# Patient Record
Sex: Female | Born: 1945 | Race: White | Hispanic: No | Marital: Single | Smoking: Former smoker
Health system: Southern US, Community
[De-identification: ages and names within clinical notes are randomized; demographics above are authoritative.]

## PROBLEM LIST (undated history)

## (undated) DIAGNOSIS — K5732 Diverticulitis of large intestine without perforation or abscess without bleeding: Secondary | ICD-10-CM

## (undated) DIAGNOSIS — I059 Rheumatic mitral valve disease, unspecified: Secondary | ICD-10-CM

## (undated) DIAGNOSIS — N189 Chronic kidney disease, unspecified: Secondary | ICD-10-CM

## (undated) DIAGNOSIS — M8580 Other specified disorders of bone density and structure, unspecified site: Secondary | ICD-10-CM

## (undated) DIAGNOSIS — K635 Polyp of colon: Secondary | ICD-10-CM

## (undated) DIAGNOSIS — Q613 Polycystic kidney, unspecified: Secondary | ICD-10-CM

## (undated) DIAGNOSIS — I10 Essential (primary) hypertension: Secondary | ICD-10-CM

## (undated) DIAGNOSIS — C801 Malignant (primary) neoplasm, unspecified: Secondary | ICD-10-CM

## (undated) DIAGNOSIS — Z5189 Encounter for other specified aftercare: Secondary | ICD-10-CM

## (undated) DIAGNOSIS — E785 Hyperlipidemia, unspecified: Secondary | ICD-10-CM

## (undated) HISTORY — DX: Hyperlipidemia, unspecified: E78.5

## (undated) HISTORY — DX: Diverticulitis of large intestine without perforation or abscess without bleeding: K57.32

## (undated) HISTORY — DX: Polyp of colon: K63.5

## (undated) HISTORY — DX: Malignant (primary) neoplasm, unspecified: C80.1

## (undated) HISTORY — DX: Essential (primary) hypertension: I10

## (undated) HISTORY — DX: Rheumatic mitral valve disease, unspecified: I05.9

## (undated) HISTORY — PX: TONSILLECTOMY: SUR1361

## (undated) HISTORY — PX: ESOPHAGOGASTRODUODENOSCOPY: SHX1529

## (undated) HISTORY — DX: Chronic kidney disease, unspecified: N18.9

## (undated) HISTORY — DX: Polycystic kidney, unspecified: Q61.3

## (undated) HISTORY — DX: Encounter for other specified aftercare: Z51.89

## (undated) HISTORY — DX: Other specified disorders of bone density and structure, unspecified site: M85.80

---

## 1985-02-25 HISTORY — PX: ABDOMINAL HYSTERECTOMY: SHX81

## 1989-02-25 DIAGNOSIS — K5732 Diverticulitis of large intestine without perforation or abscess without bleeding: Secondary | ICD-10-CM

## 1989-02-25 HISTORY — DX: Diverticulitis of large intestine without perforation or abscess without bleeding: K57.32

## 1997-02-25 HISTORY — PX: OTHER SURGICAL HISTORY: SHX169

## 1997-05-30 ENCOUNTER — Other Ambulatory Visit: Admission: RE | Admit: 1997-05-30 | Discharge: 1997-05-30 | Payer: Self-pay | Admitting: Obstetrics and Gynecology

## 1998-10-06 ENCOUNTER — Other Ambulatory Visit: Admission: RE | Admit: 1998-10-06 | Discharge: 1998-10-06 | Payer: Self-pay | Admitting: Obstetrics and Gynecology

## 1999-11-14 ENCOUNTER — Ambulatory Visit (HOSPITAL_COMMUNITY): Admission: RE | Admit: 1999-11-14 | Discharge: 1999-11-14 | Payer: Self-pay | Admitting: Nephrology

## 1999-11-14 ENCOUNTER — Encounter: Payer: Self-pay | Admitting: Nephrology

## 1999-11-15 ENCOUNTER — Other Ambulatory Visit: Admission: RE | Admit: 1999-11-15 | Discharge: 1999-11-15 | Payer: Self-pay | Admitting: Obstetrics and Gynecology

## 2000-11-17 ENCOUNTER — Other Ambulatory Visit: Admission: RE | Admit: 2000-11-17 | Discharge: 2000-11-17 | Payer: Self-pay | Admitting: Obstetrics and Gynecology

## 2001-02-06 ENCOUNTER — Encounter: Payer: Self-pay | Admitting: Gastroenterology

## 2001-07-10 ENCOUNTER — Encounter: Payer: Self-pay | Admitting: Family Medicine

## 2001-07-10 ENCOUNTER — Ambulatory Visit (HOSPITAL_COMMUNITY): Admission: RE | Admit: 2001-07-10 | Discharge: 2001-07-10 | Payer: Self-pay | Admitting: Family Medicine

## 2003-12-27 ENCOUNTER — Ambulatory Visit: Payer: Self-pay | Admitting: Internal Medicine

## 2004-01-18 ENCOUNTER — Ambulatory Visit: Payer: Self-pay | Admitting: Internal Medicine

## 2005-01-03 ENCOUNTER — Ambulatory Visit: Payer: Self-pay | Admitting: Internal Medicine

## 2005-04-24 ENCOUNTER — Ambulatory Visit: Payer: Self-pay | Admitting: Internal Medicine

## 2006-01-02 ENCOUNTER — Ambulatory Visit: Payer: Self-pay | Admitting: Internal Medicine

## 2006-05-14 ENCOUNTER — Ambulatory Visit: Payer: Self-pay | Admitting: Internal Medicine

## 2006-05-14 LAB — CONVERTED CEMR LAB
ALT: 18 units/L (ref 0–40)
AST: 26 units/L (ref 0–37)
Albumin: 3.9 g/dL (ref 3.5–5.2)
Alkaline Phosphatase: 40 units/L (ref 39–117)
BUN: 29 mg/dL — ABNORMAL HIGH (ref 6–23)
Basophils Absolute: 0.2 10*3/uL — ABNORMAL HIGH (ref 0.0–0.1)
Basophils Relative: 2.6 % — ABNORMAL HIGH (ref 0.0–1.0)
Bilirubin, Direct: 0.1 mg/dL (ref 0.0–0.3)
CO2: 30 meq/L (ref 19–32)
Calcium: 9.8 mg/dL (ref 8.4–10.5)
Chloride: 101 meq/L (ref 96–112)
Cholesterol: 267 mg/dL (ref 0–200)
Creatinine, Ser: 1.6 mg/dL — ABNORMAL HIGH (ref 0.4–1.2)
Direct LDL: 134.5 mg/dL
Eosinophils Absolute: 0.1 10*3/uL (ref 0.0–0.6)
Eosinophils Relative: 1.3 % (ref 0.0–5.0)
Free T4: 0.8 ng/dL (ref 0.6–1.6)
GFR calc Af Amer: 43 mL/min
GFR calc non Af Amer: 35 mL/min
Glucose, Bld: 93 mg/dL (ref 70–99)
HCT: 38 % (ref 36.0–46.0)
HDL: 89 mg/dL (ref >39.0)
Hemoglobin: 13.5 g/dL (ref 12.0–15.0)
Lymphocytes Relative: 22.9 % (ref 12.0–46.0)
MCHC: 35.5 g/dL (ref 30.0–36.0)
MCV: 91.2 fL (ref 78.0–100.0)
Monocytes Absolute: 0.7 10*3/uL (ref 0.2–0.7)
Monocytes Relative: 10.2 % (ref 3.0–11.0)
Neutro Abs: 4.1 10*3/uL (ref 1.4–7.7)
Neutrophils Relative %: 63 % (ref 43.0–77.0)
Platelets: 293 10*3/uL (ref 150–400)
Potassium: 3.8 meq/L (ref 3.5–5.1)
RBC: 4.16 M/uL (ref 3.87–5.11)
RDW: 11.1 % — ABNORMAL LOW (ref 11.5–14.6)
Sodium: 140 meq/L (ref 135–145)
T3, Free: 3.4 pg/mL (ref 2.3–4.2)
TSH: 1.67 microintl units/mL (ref 0.35–5.50)
Total Bilirubin: 0.8 mg/dL (ref 0.3–1.2)
Total CHOL/HDL Ratio: 3
Total Protein: 7.3 g/dL (ref 6.0–8.3)
Triglycerides: 233 mg/dL (ref 0–149)
VLDL: 47 mg/dL — ABNORMAL HIGH (ref 0–40)
WBC: 6.6 10*3/uL (ref 4.5–10.5)

## 2006-05-26 ENCOUNTER — Ambulatory Visit: Payer: Self-pay | Admitting: Gastroenterology

## 2006-06-04 ENCOUNTER — Ambulatory Visit: Payer: Self-pay | Admitting: Internal Medicine

## 2006-11-10 ENCOUNTER — Encounter: Payer: Self-pay | Admitting: Internal Medicine

## 2006-11-11 ENCOUNTER — Ambulatory Visit: Payer: Self-pay | Admitting: Internal Medicine

## 2006-11-11 DIAGNOSIS — M545 Low back pain, unspecified: Secondary | ICD-10-CM | POA: Insufficient documentation

## 2006-11-12 ENCOUNTER — Ambulatory Visit: Payer: Self-pay | Admitting: Internal Medicine

## 2006-11-14 ENCOUNTER — Encounter (INDEPENDENT_AMBULATORY_CARE_PROVIDER_SITE_OTHER): Payer: Self-pay | Admitting: *Deleted

## 2006-11-14 LAB — CONVERTED CEMR LAB
ALT: 19 units/L (ref 0–35)
AST: 22 units/L (ref 0–37)
Albumin: 3.7 g/dL (ref 3.5–5.2)
Alkaline Phosphatase: 42 units/L (ref 39–117)
BUN: 31 mg/dL — ABNORMAL HIGH (ref 6–23)
Bilirubin, Direct: 0.1 mg/dL (ref 0.0–0.3)
CO2: 28 meq/L (ref 19–32)
Calcium: 9.6 mg/dL (ref 8.4–10.5)
Chloride: 104 meq/L (ref 96–112)
Cholesterol: 242 mg/dL (ref 0–200)
Creatinine, Ser: 1.7 mg/dL — ABNORMAL HIGH (ref 0.4–1.2)
Direct LDL: 111.7 mg/dL
GFR calc Af Amer: 40 mL/min
GFR calc non Af Amer: 33 mL/min
Glucose, Bld: 108 mg/dL — ABNORMAL HIGH (ref 70–99)
HDL: 94.1 mg/dL (ref >39.0)
Potassium: 4.1 meq/L (ref 3.5–5.1)
Sodium: 142 meq/L (ref 135–145)
Total Bilirubin: 0.8 mg/dL (ref 0.3–1.2)
Total CHOL/HDL Ratio: 2.6
Total Protein: 7.1 g/dL (ref 6.0–8.3)
Triglycerides: 157 mg/dL — ABNORMAL HIGH (ref 0–149)
VLDL: 31 mg/dL (ref 0–40)

## 2006-11-17 ENCOUNTER — Encounter: Payer: Self-pay | Admitting: Internal Medicine

## 2006-11-17 ENCOUNTER — Ambulatory Visit: Payer: Self-pay

## 2006-11-26 ENCOUNTER — Telehealth: Payer: Self-pay | Admitting: Internal Medicine

## 2006-12-15 ENCOUNTER — Encounter: Payer: Self-pay | Admitting: Internal Medicine

## 2007-02-03 ENCOUNTER — Ambulatory Visit: Payer: Self-pay | Admitting: Internal Medicine

## 2007-02-18 ENCOUNTER — Encounter: Payer: Self-pay | Admitting: Internal Medicine

## 2007-04-08 ENCOUNTER — Encounter: Payer: Self-pay | Admitting: Internal Medicine

## 2007-07-22 ENCOUNTER — Ambulatory Visit: Payer: Self-pay | Admitting: Internal Medicine

## 2007-07-22 DIAGNOSIS — Z8719 Personal history of other diseases of the digestive system: Secondary | ICD-10-CM | POA: Insufficient documentation

## 2007-07-22 DIAGNOSIS — Q613 Polycystic kidney, unspecified: Secondary | ICD-10-CM | POA: Insufficient documentation

## 2007-07-22 DIAGNOSIS — K5730 Diverticulosis of large intestine without perforation or abscess without bleeding: Secondary | ICD-10-CM

## 2007-07-26 LAB — CONVERTED CEMR LAB
BUN: 34 mg/dL — ABNORMAL HIGH (ref 6–23)
Chloride: 97 meq/L (ref 96–112)
Eosinophils Relative: 5.2 % — ABNORMAL HIGH (ref 0.0–5.0)
GFR calc Af Amer: 37 mL/min
Glucose, Bld: 74 mg/dL (ref 70–99)
HCT: 36.7 % (ref 36.0–46.0)
Monocytes Absolute: 0.3 10*3/uL (ref 0.1–1.0)
Monocytes Relative: 4.3 % (ref 3.0–12.0)
Platelets: 265 10*3/uL (ref 150–400)
Potassium: 5 meq/L (ref 3.5–5.1)
RBC: 3.97 M/uL (ref 3.87–5.11)
WBC: 7.6 10*3/uL (ref 4.5–10.5)

## 2007-07-27 ENCOUNTER — Encounter (INDEPENDENT_AMBULATORY_CARE_PROVIDER_SITE_OTHER): Payer: Self-pay | Admitting: *Deleted

## 2007-08-31 ENCOUNTER — Encounter: Payer: Self-pay | Admitting: Internal Medicine

## 2007-11-18 ENCOUNTER — Ambulatory Visit: Payer: Self-pay | Admitting: Internal Medicine

## 2007-11-18 ENCOUNTER — Telehealth (INDEPENDENT_AMBULATORY_CARE_PROVIDER_SITE_OTHER): Payer: Self-pay | Admitting: *Deleted

## 2008-03-03 ENCOUNTER — Encounter: Payer: Self-pay | Admitting: Internal Medicine

## 2008-04-11 ENCOUNTER — Encounter: Payer: Self-pay | Admitting: Internal Medicine

## 2008-07-04 ENCOUNTER — Ambulatory Visit: Payer: Self-pay | Admitting: Internal Medicine

## 2008-07-04 DIAGNOSIS — Z860100 Personal history of colon polyps, unspecified: Secondary | ICD-10-CM | POA: Insufficient documentation

## 2008-07-04 DIAGNOSIS — I10 Essential (primary) hypertension: Secondary | ICD-10-CM | POA: Insufficient documentation

## 2008-07-04 DIAGNOSIS — E785 Hyperlipidemia, unspecified: Secondary | ICD-10-CM | POA: Insufficient documentation

## 2008-07-10 LAB — CONVERTED CEMR LAB
Alkaline Phosphatase: 39 units/L (ref 39–117)
Bilirubin, Direct: 0 mg/dL (ref 0.0–0.3)
Calcium: 9.5 mg/dL (ref 8.4–10.5)
GFR calc non Af Amer: 24.11 mL/min (ref >60)
HDL: 89.3 mg/dL (ref >39.00)
Sodium: 141 meq/L (ref 135–145)
Total Bilirubin: 0.8 mg/dL (ref 0.3–1.2)
Total CHOL/HDL Ratio: 3
Triglycerides: 183 mg/dL — ABNORMAL HIGH (ref 0.0–149.0)

## 2008-07-11 ENCOUNTER — Encounter (INDEPENDENT_AMBULATORY_CARE_PROVIDER_SITE_OTHER): Payer: Self-pay | Admitting: *Deleted

## 2008-07-11 ENCOUNTER — Telehealth (INDEPENDENT_AMBULATORY_CARE_PROVIDER_SITE_OTHER): Payer: Self-pay | Admitting: *Deleted

## 2008-07-19 ENCOUNTER — Ambulatory Visit: Payer: Self-pay | Admitting: Internal Medicine

## 2008-07-19 ENCOUNTER — Encounter (INDEPENDENT_AMBULATORY_CARE_PROVIDER_SITE_OTHER): Payer: Self-pay | Admitting: *Deleted

## 2008-07-19 LAB — CONVERTED CEMR LAB
OCCULT 1: NEGATIVE
OCCULT 2: NEGATIVE

## 2008-09-27 ENCOUNTER — Encounter: Payer: Self-pay | Admitting: Internal Medicine

## 2008-09-28 ENCOUNTER — Encounter: Payer: Self-pay | Admitting: Internal Medicine

## 2008-12-16 ENCOUNTER — Encounter: Payer: Self-pay | Admitting: Internal Medicine

## 2008-12-22 ENCOUNTER — Encounter (INDEPENDENT_AMBULATORY_CARE_PROVIDER_SITE_OTHER): Payer: Self-pay | Admitting: *Deleted

## 2009-02-21 ENCOUNTER — Telehealth: Payer: Self-pay | Admitting: Internal Medicine

## 2009-02-25 HISTORY — PX: COLONOSCOPY: SHX174

## 2009-03-21 ENCOUNTER — Encounter: Payer: Self-pay | Admitting: Internal Medicine

## 2009-04-06 ENCOUNTER — Encounter: Payer: Self-pay | Admitting: Internal Medicine

## 2009-04-12 ENCOUNTER — Encounter: Payer: Self-pay | Admitting: Internal Medicine

## 2009-07-25 ENCOUNTER — Ambulatory Visit: Payer: Self-pay | Admitting: Internal Medicine

## 2009-07-25 ENCOUNTER — Telehealth (INDEPENDENT_AMBULATORY_CARE_PROVIDER_SITE_OTHER): Payer: Self-pay | Admitting: *Deleted

## 2009-08-10 ENCOUNTER — Ambulatory Visit: Payer: Self-pay | Admitting: Internal Medicine

## 2009-08-10 LAB — CONVERTED CEMR LAB
Bilirubin Urine: NEGATIVE
Glucose, Urine, Semiquant: NEGATIVE
Specific Gravity, Urine: <1.005
Urobilinogen, UA: 0.2

## 2009-08-17 ENCOUNTER — Ambulatory Visit: Payer: Self-pay | Admitting: Internal Medicine

## 2009-08-17 DIAGNOSIS — N189 Chronic kidney disease, unspecified: Secondary | ICD-10-CM | POA: Insufficient documentation

## 2009-08-17 LAB — CONVERTED CEMR LAB
ALT: 13 units/L (ref 0–35)
AST: 21 units/L (ref 0–37)
Basophils Absolute: 0 10*3/uL (ref 0.0–0.1)
CO2: 26 meq/L (ref 19–32)
Calcium: 8.9 mg/dL (ref 8.4–10.5)
Cholesterol, target level: 200 mg/dL
Cholesterol: 235 mg/dL — ABNORMAL HIGH (ref 0–200)
Direct LDL: 109.8 mg/dL
Eosinophils Absolute: 0.2 10*3/uL (ref 0.0–0.7)
Eosinophils Relative: 2.5 % (ref 0.0–5.0)
GFR calc non Af Amer: 18.65 mL/min (ref >60)
LDL Goal: 160 mg/dL
MCV: 91 fL (ref 78.0–100.0)
Monocytes Absolute: 0.7 10*3/uL (ref 0.1–1.0)
Neutrophils Relative %: 65.6 % (ref 43.0–77.0)
Platelets: 245 10*3/uL (ref 150.0–400.0)
RDW: 12.3 % (ref 11.5–14.6)
Sodium: 140 meq/L (ref 135–145)
TSH: 1.27 microintl units/mL (ref 0.35–5.50)
Total Bilirubin: 0.3 mg/dL (ref 0.3–1.2)
Triglycerides: 231 mg/dL — ABNORMAL HIGH (ref 0.0–149.0)
WBC: 6.3 10*3/uL (ref 4.5–10.5)

## 2009-08-18 ENCOUNTER — Ambulatory Visit: Payer: Self-pay | Admitting: Internal Medicine

## 2009-09-21 ENCOUNTER — Encounter (INDEPENDENT_AMBULATORY_CARE_PROVIDER_SITE_OTHER): Payer: Self-pay | Admitting: *Deleted

## 2009-09-21 ENCOUNTER — Ambulatory Visit: Payer: Self-pay | Admitting: Gastroenterology

## 2009-09-25 ENCOUNTER — Encounter: Payer: Self-pay | Admitting: Internal Medicine

## 2009-10-03 ENCOUNTER — Ambulatory Visit: Payer: Self-pay | Admitting: Gastroenterology

## 2009-12-07 ENCOUNTER — Encounter: Payer: Self-pay | Admitting: Internal Medicine

## 2009-12-29 ENCOUNTER — Encounter: Admission: RE | Admit: 2009-12-29 | Discharge: 2009-12-29 | Payer: Self-pay | Admitting: Nephrology

## 2010-02-05 ENCOUNTER — Encounter: Payer: Self-pay | Admitting: Internal Medicine

## 2010-02-25 HISTORY — PX: BILATERAL SALPINGOOPHORECTOMY: SHX1223

## 2010-03-09 ENCOUNTER — Ambulatory Visit
Admission: RE | Admit: 2010-03-09 | Discharge: 2010-03-09 | Payer: Self-pay | Source: Home / Self Care | Attending: Internal Medicine | Admitting: Internal Medicine

## 2010-03-27 NOTE — Procedures (Signed)
Summary: Colonoscopy  Patient: [name] Note: All result statuses are Final unless otherwise noted.  Tests: (1) Colonoscopy (COL)   COL Colonoscopy           DONE     [facility]     [address]     [place], [place]  [address]           COLONOSCOPY PROCEDURE REPORT           PATIENT:  Pamela Mcknight, Pamela Mcknight  MR#:  [id number]     BIRTHDATE:  11-20-1945, 64 yrs. old  GENDER:  female     ENDOSCOPIST:  Vania Rea. Jarold Motto, MD, [facility]     REF. BY:     PROCEDURE DATE:  10/03/2009     PROCEDURE:  Average-risk screening colonoscopy     G0121     ASA CLASS:  Class III     INDICATIONS:  Routine Risk Screening POLYCYSTIC KIDNEY DISEASE.HX.     OF PRIOR DIVERTICULITIS.     MEDICATIONS:   Fentanyl 50 mcg IV, Versed 4 mg IV           DESCRIPTION OF PROCEDURE:   After the risks benefits and     alternatives of the procedure were thoroughly explained, informed     consent was obtained.  Digital rectal exam was performed and     revealed no abnormalities.   The LB [id number] endoscope     was introduced through the anus and advanced to the cecum, which     was identified by both the appendix and ileocecal valve, without     limitations.  The quality of the prep was excellent, using     MoviPrep.  The instrument was then slowly withdrawn as the colon     was fully examined.     <<PROCEDUREIMAGES>>           FINDINGS:  Severe diverticulosis was found in the sigmoid to     descending colon segments. THICKENED HAUSTRAL FOLDS NOTED.  No     polyps or cancers were seen.   Retroflexed views in the rectum     revealed hypertrophied anal papillae.    The scope was then     withdrawn from the patient and the procedure completed.           COMPLICATIONS:  None     ENDOSCOPIC IMPRESSION:     1) Severe diverticulosis in the sigmoid to descending colon     segments     2) No polyps or cancers     RECOMMENDATIONS:     1) high fiber diet     2) Continue current colorectal screening  recommendations for     "routine risk" patients with a repeat colonoscopy in 10 years.     REPEAT EXAM:  No           ______________________________     Vania Rea. Jarold Motto, MD, [facility]           CC:           n.     eSIGNED:   Vania Rea. Patterson at 10/03/2009 03:44 PM           August Albino, [id number]  Note: An exclamation mark (!) indicates a result that was not dispersed into the flowsheet. Document Creation Date: 10/03/2009 3:46 PM _______________________________________________________________________  (1) Order result status: Final Collection or observation date-time: 10/03/2009 15:32 Requested date-time:  Receipt date-time:  Reported date-time:  Referring Physician:  Ordering Physician: Sheryn Bison [phone]) Specimen Source:  Source: Launa Grill Order Number: [id number] Lab site:   Appended Document: Colonoscopy    Clinical Lists Changes  Observations: Added new observation of COLONNXTDUE: 09/2019 (10/03/2009 15:50)

## 2010-03-27 NOTE — Assessment & Plan Note (Signed)
Summary: CPX,LABS PRIOR, BC[employer] INS/RH......   Vital Signs:  Patient profile:   65 year old female Height:      65 inches Weight:      126.6 pounds BMI:     21.14 Temp:     97.9 degrees F oral Pulse rate:   59 / minute Resp:     16 per minute BP sitting:   108 / 62  (left arm) Cuff size:   regular  Vitals Entered By: Shonna Chock (August 17, 2009 10:25 AM)   History of Present Illness: Ms. Strausser is here for a physical; she is asymptomatic . Her bronchitis has resolved.      This is a 65 year old woman who presents for Hyperlipidemia follow-up.  The patient reports fatigue, but denies muscle aches, GI upset, abdominal pain, flushing, itching, constipation, and diarrhea.  Other symptoms include pedal edema in L ankle.  The patient denies the following symptoms: chest pain/pressure, exercise intolerance, dypsnea, palpitations, and syncope.  Compliance with medications (by patient report) has been near 100%.  Dietary compliance has been fair.  The patient reports no exercise.  Adjunctive measures currently used by the patient include fish oil supplements.        The patient also presents for Hypertension follow-up.  The patient denies lightheadedness, headaches, and rash.  The patient denies the following associated symptoms: syncope.  Compliance with medications (by patient report) has been near 100%.   No  salt restriction. BP @ home not checked         Tb skin test & repeat colonoscopy due prior to placement on  Renal Transplant List in [place]  Lipid Management History:      Positive NCEP/ATP III risk factors include female age 54 years old or older and hypertension.  Negative NCEP/ATP III risk factors include non-diabetic, HDL cholesterol greater than 60, no family history for ischemic heart disease, non-tobacco-user status, no ASHD (atherosclerotic heart disease), no prior stroke/TIA, no peripheral vascular disease, and no history of aortic aneurysm.     Preventive Screening-Counseling  & Management  Caffeine-Diet-Exercise     Does Patient Exercise: no  Allergies: 1)  ! Sulfa  Past History:  Past Medical History: Diverticulitis, PMH  of 1991 Diverticulosis, colon Polycystic Kidney Diease , Dr Casimiro Needle Hypertension Hyperlipidemia: NMR 2007: LDL 92(1308/0), HDL 105,TG 169.LDL goal = < 100. Framingham Study LDL goal = < 160. MR @ 2 D ECHO  Colonic polyps, Hyperplastic 2002  Past Surgical History: Colon polypectomy,Hyperplastic 2002, Dr Jarold Motto Cosmetic eye surgery Hysterectomy for fibroids 1987 Tonsillectomy  Family History: Father:  CVA,Polycystic Kidney Disease Mother: Glaucoma,elevated  lipids, Macular Degeneration Siblings: sister: Polycystic Kidney Disease; P uncle : Polycystic Kidney Disease; PGM : PKD  Social History: Retired(Teacher) Former Smoker: quit 1981 Alcohol use-yes: socially Regular exercise-no Does Patient Exercise:  no  Review of Systems  The patient denies anorexia, fever, weight loss, weight gain, vision loss, decreased hearing, hoarseness, prolonged cough, hemoptysis, abdominal pain, melena, hematochezia, severe indigestion/heartburn, suspicious skin lesions, depression, unusual weight change, abnormal bleeding, enlarged lymph nodes, and angioedema.   GU:  Denies discharge, dysuria, hematuria, incontinence, urinary frequency, and urinary hesitancy; Nocturia X1. MS:  Denies joint pain, joint redness, joint swelling, low back pain, mid back pain, and thoracic pain. Derm:  Denies changes in nail beds, dryness, hair loss, lesion(s), and rash.  Physical Exam  General:  Thin but well-nourished, alert,appropriate and cooperative throughout examination Head:  Normocephalic and atraumatic without obvious abnormalities.  Eyes:  No corneal or conjunctival inflammation noted.  Perrla. Funduscopic exam benign, without hemorrhages, exudates or papilledema.  Ears:  External ear exam shows no significant lesions or deformities.  Otoscopic  examination reveals clear canals, tympanic membranes are intact bilaterally without bulging, retraction, inflammation or discharge. Hearing is grossly normal bilaterally. Nose:  External nasal examination shows no deformity or inflammation. Nasal mucosa are pink and moist without lesions or exudates. Septum deviated to R  Mouth:  Oral mucosa and oropharynx without lesions or exudates.  Teeth in good repair. Neck:  No deformities, masses, or tenderness noted. Lungs:  Normal respiratory effort, chest expands symmetrically. Lungs are clear to auscultation, no crackles or wheezes. Heart:  Normal rate and regular rhythm. Accentuated  S2  without gallop, murmur, click, rub or other extra sounds.S4 Abdomen:  Bowel sounds positive,abdomen soft and non-tender without  organomegaly or hernias noted. ? palpable kidney R mid -RUQ  Genitalia:  Dr Henderson Cloud Msk:  No deformity or scoliosis noted of thoracic or lumbar spine.   Pulses:  R and L carotid,radial,dorsalis pedis and posterior tibial pulses are full and equal bilaterally Extremities:  No clubbing, cyanosis, edema, or deformity noted with normal full range of motion of all joints.  Minor knee crepitus Neurologic:  alert & oriented X3 and DTRs symmetrical and normal.   Skin:  Intact without suspicious lesions or rashes Cervical Nodes:  No lymphadenopathy noted Axillary Nodes:  No palpable lymphadenopathy Psych:  memory intact for recent and remote, normally interactive, and good eye contact.     Impression & Recommendations:  Problem # 1:  ROUTINE GENERAL MEDICAL EXAM@HEALTH  CARE FACL (ICD-V70.0)  Orders: EKG w/ Interpretation (93000)  Problem # 2:  HYPERLIPIDEMIA (ICD-272.4) LDL goal = 100-160 Her updated medication list for this problem includes:    Pravastatin Sodium 20 Mg Tabs (Pravastatin sodium) .Marland Kitchen... 1 at bedtime m,w & fri  Problem # 3:  HYPERTENSION (ICD-401.9)  controlled The following medications were removed from the medication  list:    Diovan 160 Mg Tabs (Valsartan) .Marland Kitchen... 1 once daily in place of ace-i Her updated medication list for this problem includes:    Atenolol 100 Mg Tabs (Atenolol) .Marland Kitchen... Take one tablet daily    Diovan Hct 80-12.5 Mg Tabs (Valsartan-hydrochlorothiazide) .Marland Kitchen... 1 by mouth once daily  Orders: EKG w/ Interpretation (93000)  Problem # 4:  POLYCYSTIC KIDNEY DISEASE (ICD-753.12)  Orders: Gastroenterology Referral (GI)  Problem # 5:  COLONIC POLYPS, HX OF (ICD-V12.72)  PMH of  Orders: Gastroenterology Referral (GI)  Problem # 6:  RENAL INSUFFICIENCY, CHRONIC (ICD-585.9)  In context of #4  Orders: Gastroenterology Referral (GI)  Complete Medication List: 1)  Atenolol 100 Mg Tabs (Atenolol) .... Take one tablet daily 2)  Estrace 0.5 Mg Tabs (Estradiol) 3)  Pravastatin Sodium 20 Mg Tabs (Pravastatin sodium) .Marland Kitchen.. 1 at bedtime m,w & fri 4)  Diovan Hct 80-12.5 Mg Tabs (Valsartan-hydrochlorothiazide) .Marland Kitchen.. 1 by mouth once daily  Other Orders: Tdap => 4yrs IM (16606) Admin 1st Vaccine (30160)  Lipid Assessment/Plan:      Based on NCEP/ATP III, the patient's risk factor category is "0-1 risk factors".  The patient's lipid goals are as follows: Total cholesterol goal is 200; LDL cholesterol goal is 160; HDL cholesterol goal is 40; Triglyceride goal is 150.    Patient Instructions: 1)  Share corrected records with all MDs  seen. Schedule Tb skin test.   Immunizations Administered:  Tetanus Vaccine:    Vaccine Type: Tdap    Site: right deltoid  Mfr: GlaxoSmithKline    Dose: 0.5 ml    Route: IM    Given by: Shonna Chock    Exp. Date: 05/20/2011    Lot #: [id number]    VIS given: 01/13/07 version given August 17, 2009.

## 2010-03-27 NOTE — Letter (Signed)
Summary: [facility]  [facility]   Imported By: Lanelle Bal 10/10/2009 10:55:59  _____________________________________________________________________  External Attachment:    Type:   Image     Comment:   External Document

## 2010-03-27 NOTE — Letter (Signed)
Summary: [facility] Instructions  [facility]  [address] [place], [place] [address]   Phone: [phone]  Fax: [phone]       NELLINE LIO    December 14, 1945    MRN: [id number]        Procedure Day /Date: Tuesday, 10/03/09     Arrival Time: 2:00      Procedure Time: 3:00     Location of Procedure:                    Juliann Pares  [facility] (4th Floor)                        PREPARATION FOR COLONOSCOPY WITH MOVIPREP   Starting 5 days prior to your procedure 09/28/09 do not eat nuts, seeds, popcorn, corn, beans, peas,  salads, or any raw vegetables.  Do not take any fiber supplements (e.g. Metamucil, Citrucel, and Benefiber).  THE DAY BEFORE YOUR PROCEDURE         DATE: 10/02/09    DAY: Monday  1.  Drink clear liquids the entire day-NO SOLID FOOD  2.  Do not drink anything colored red or purple.  Avoid juices with pulp.  No orange juice.  3.  Drink at least 64 oz. (8 glasses) of fluid/clear liquids during the day to prevent dehydration and help the prep work efficiently.  CLEAR LIQUIDS INCLUDE: Water Jello Ice Popsicles Tea (sugar ok, no milk/cream) Powdered fruit flavored drinks Coffee (sugar ok, no milk/cream) Gatorade Juice: apple, white grape, white cranberry  Lemonade Clear bullion, consomm, broth Carbonated beverages (any kind) Strained chicken noodle soup Hard Candy                             4.  In the morning, mix first dose of MoviPrep solution:    Empty 1 Pouch A and 1 Pouch B into the disposable container    Add lukewarm drinking water to the top line of the container. Mix to dissolve    Refrigerate (mixed solution should be used within 24 hrs)  5.  Begin drinking the prep at 5:00 p.m. The MoviPrep container is divided by 4 marks.   Every 15 minutes drink the solution down to the next mark (approximately 8 oz) until the full liter is complete.   6.  Follow completed prep with 16 oz of clear liquid of your choice (Nothing red  or purple).  Continue to drink clear liquids until bedtime.  7.  Before going to bed, mix second dose of MoviPrep solution:    Empty 1 Pouch A and 1 Pouch B into the disposable container    Add lukewarm drinking water to the top line of the container. Mix to dissolve    Refrigerate  THE DAY OF YOUR PROCEDURE      DATE: 10/03/09   DAY:  Tuesday  Beginning at 10:00 a.m. (5 hours before procedure):         1. Every 15 minutes, drink the solution down to the next mark (approx 8 oz) until the full liter is complete.  2. Follow completed prep with 16 oz. of clear liquid of your choice.    3. You may drink clear liquids until 1:00  (2 HOURS BEFORE PROCEDURE).   MEDICATION INSTRUCTIONS  Unless otherwise instructed, you should take regular prescription medications with a small sip of water   as early as  possible the morning of your procedure.                  OTHER INSTRUCTIONS  You will need a responsible adult at least 63 years of age to accompany you and drive you home.   This person must remain in the waiting room during your procedure.  Wear loose fitting clothing that is easily removed.  Leave jewelry and other valuables at home.  However, you may wish to bring a book to read or  an iPod/MP3 player to listen to music as you wait for your procedure to start.  Remove all body piercing jewelry and leave at home.  Total time from sign-in until discharge is approximately 2-3 hours.  You should go home directly after your procedure and rest.  You can resume normal activities the  day after your procedure.  The day of your procedure you should not:   Drive   Make legal decisions   Operate machinery   Drink alcohol   Return to work  You will receive specific instructions about eating, activities and medications before you leave.    The above instructions have been reviewed and explained to me by   _______________________    I fully understand and can  verbalize these instructions _____________________________ Date _________

## 2010-03-27 NOTE — Miscellaneous (Signed)
Summary: [facility]   Imported By: Lanelle Bal 12/27/2009 11:56:14  _____________________________________________________________________  External Attachment:    Type:   Image     Comment:   External Document

## 2010-03-27 NOTE — Letter (Signed)
Summary: [facility]  [facility]   Imported By: Lanelle Bal 04/18/2009 11:41:29  _____________________________________________________________________  External Attachment:    Type:   Image     Comment:   External Document

## 2010-03-27 NOTE — Assessment & Plan Note (Signed)
Summary: tb /cbs   Nurse Visit   Allergies: 1)  ! Sulfa  Immunizations Administered:  PPD Skin Test:    Vaccine Type: PPD    Site: right forearm    Mfr: Sanofi Pasteur    Dose: 0.1 ml    Route: ID    Given by: Shonna Chock    Exp. Date: 12/08/2010    Lot #: [id number] Patient to return on Monday for Skin Test reading./Chrae [facility]  August 18, 2009 10:58 AM  Orders Added: 1)  TB Skin Test [86580] 2)  Admin 1st Vaccine [90471]  Appended Document: tb /cbs   PPD Results    Date of reading: 08/21/2009    Results: < 5mm    Interpretation: negative

## 2010-03-27 NOTE — Assessment & Plan Note (Signed)
Summary: CONSULT BEFORE COL BEFORE KIDNEY TRANSPLANT.Marland KitchenEM    History of Present Illness Visit Type: Initial Consult Primary GI MD: Sheryn Bison MD FACP FAGA Primary Provider: Marga Melnick, MD Requesting Provider: Marga Melnick, MD Chief Complaint: Colon screening before kidney transplant History of Present Illness:   65 year old Caucasian female with a symptomatic polycystic kidney disease followed closely by Dr. Casimiro Needle [facility]. Apparently she has a GFR of approximately 20 and is being considered for renal transplantation. It has been recommended by her transplant surgeons that she have colonoscopy before this procedure.  She had an episode of acute diverticulitis in 1992. I did screening colonoscopy in 2002 and this exam was unremarkable except for left colon diverticulosis. The patient currently is asymptomatic in terms of any gastrointestinal complaints. Specifically, she has regular bowel movements without melena or hematochezia, denies upper gastrointestinal or hepatobiliary complaints. Appetite is good her weight is stable. She does take a variety of medications including daily aspirin.She Is not on dialysis but does take Diovan-HCTZ daily. Family history of course is positive for polycystic kidney disease and hypertension.   GI Review of Systems      Denies abdominal pain, acid reflux, belching, bloating, chest pain, dysphagia with liquids, dysphagia with solids, heartburn, loss of appetite, nausea, vomiting, vomiting blood, weight loss, and  weight gain.      Reports diverticulosis.     Denies anal fissure, black tarry stools, change in bowel habit, constipation, diarrhea, fecal incontinence, heme positive stool, hemorrhoids, irritable bowel syndrome, jaundice, light color stool, liver problems, rectal bleeding, and  rectal pain.    Current Medications (verified): 1)  Atenolol 100 Mg  Tabs (Atenolol) .... Take One Tablet Daily 2)  Estrace 0.5 Mg   Tabs (Estradiol) 3)  Pravastatin Sodium 20 Mg Tabs (Pravastatin Sodium) .Marland Kitchen.. 1 At Bedtime M,w & Fri 4)  Diovan Hct 80-12.5 Mg Tabs (Valsartan-Hydrochlorothiazide) .Marland Kitchen.. 1 By Mouth Once Daily 5)  Aspirin 81 Mg Tbec (Aspirin) .... Once Daily 6)  Caltrate 600 1500 Mg Tabs (Calcium Carbonate) .... Once Daily 7)  Ra Vitamin D-3 2000 Unit Caps (Cholecalciferol) .... Once Daily 8)  Fish Oil 1000 Mg Caps (Omega-3 Fatty Acids) .... Every Other Day  Allergies (verified): 1)  ! Sulfa  Past History:  Past medical, surgical, family and social histories (including risk factors) reviewed for relevance to current acute and chronic problems.  Past Medical History: Reviewed history from 08/17/2009 and no changes required. Diverticulitis, PMH  of 1991 Diverticulosis, colon Polycystic Kidney Diease , Dr Casimiro Needle Hypertension Hyperlipidemia: NMR 2007: LDL 92(1308/0), HDL 105,TG 169.LDL goal = < 100. Framingham Study LDL goal = < 160. MR @ 2 D ECHO  Colonic polyps, Hyperplastic 2002  Past Surgical History: Reviewed history from 08/17/2009 and no changes required. Colon polypectomy,Hyperplastic 2002, Dr Jarold Motto Cosmetic eye surgery Hysterectomy for fibroids 1987 Tonsillectomy  Family History: Reviewed history from 08/17/2009 and no changes required. Father:  CVA,Polycystic Kidney Disease Mother: Glaucoma,elevated  lipids, Macular Degeneration Siblings: sister: Polycystic Kidney Disease; P uncle : Polycystic Kidney Disease; PGM : PKD  Social History: Reviewed history from 08/17/2009 and no changes required. Retired(Teacher) Former Smoker: quit 1981 Alcohol use-yes: socially Regular exercise-no  Review of Systems  The patient denies allergy/sinus, anemia, anxiety-new, arthritis/joint pain, back pain, blood in urine, breast changes/lumps, change in vision, confusion, cough, coughing up blood, depression-new, fainting, fatigue, fever, headaches-new, hearing problems, heart murmur, heart  rhythm changes, itching, menstrual pain, muscle pains/cramps, night sweats, nosebleeds, pregnancy symptoms, shortness of  breath, skin rash, sleeping problems, sore throat, swelling of feet/legs, swollen lymph glands, thirst - excessive , urination - excessive , urination changes/pain, urine leakage, vision changes, and voice change.    Vital Signs:  Patient profile:   65 year old female Height:      64 inches Weight:      124.50 pounds BMI:     21.45 Pulse rate:   80 / minute Pulse rhythm:   regular BP sitting:   114 / 62  (left arm) Cuff size:   regular  Vitals Entered By: June McMurray CMA Duncan Dull) (September 21, 2009 10:07 AM)  Physical Exam  General:  Well developed, well nourished, no acute distress.healthy appearing.   Head:  Normocephalic and atraumatic. Eyes:  PERRLA, no icterus.exam deferred to patient's ophthalmologist.   Neck:  Supple; no masses or thyromegaly. Lungs:  Clear throughout to auscultation. Heart:  Regular rate and rhythm; no murmurs, rubs,  or bruits. Abdomen:  Soft, nontender and nondistended. No masses, hepatosplenomegaly or hernias noted. Normal bowel sounds. Rectal:  deferred until time of colonoscopy.   Extremities:  No clubbing, cyanosis, edema or deformities noted. Neurologic:  Alert and  oriented x4;  grossly normal neurologically. Cervical Nodes:  No significant cervical adenopathy. Psych:  Alert and cooperative. Normal mood and affect.   Impression & Recommendations:  Problem # 1:  RENAL INSUFFICIENCY, CHRONIC (ICD-585.9) Assessment Unchanged continue all medications as per Dr. Marga Melnick and Dr. Lowell Guitar.  Problem # 2:  COLONIC POLYPS, HX OF (ICD-V12.72) Assessment: Unchanged Followup colonoscopy at her convenience with a balanced electrolyte solution preparation and place attention to maintaining good hydration. Review of her previous exams shows evidence of hyperplastic and not adenomatous polyps. With her mild renal insufficiency is at increased  risk of complications from colonoscopy, and she is aware of this situation and has agreed to proceed as planned.  Patient Instructions: 1)  You are scheduled for a colonoscopy. 2)  Pick up your prep from your pharmacy. 3)  The medication list was reviewed and reconciled.  All changed / newly prescribed medications were explained.  A complete medication list was provided to the patient / caregiver. 4)  Copy sent to : Dr. Marga Melnick and Dr. Casimiro Needle at [facility]. 5)  Please continue current medications.  6)  Colonoscopy and Flexible Sigmoidoscopy brochure given.  7)  Conscious Sedation brochure given.   Appended Document: CONSULT BEFORE COL BEFORE KIDNEY TRANSPLANT.Marland KitchenEM    Clinical Lists Changes  Medications: Added new medication of MOVIPREP 100 GM  SOLR (PEG-KCL-NACL-NASULF-NA ASC-C) As per prep instructions. - Signed Rx of MOVIPREP 100 GM  SOLR (PEG-KCL-NACL-NASULF-NA ASC-C) As per prep instructions.;  #1 x 0;  Signed;  Entered by: Ashok Cordia RN;  Authorized by: Mardella Layman MD [facility];  Method used: [facility] to [facility]  [phone]*, [address], [place], [place]  [address], Ph: [phone] or [phone], Fax: [phone] Orders: Added new Test order of Colonoscopy (Colon) - Signed    Prescriptions: MOVIPREP 100 GM  SOLR (PEG-KCL-NACL-NASULF-NA ASC-C) As per prep instructions.  #1 x 0   Entered by:   Ashok Cordia RN   Authorized by:   Mardella Layman MD [facility]   Signed by:   Ashok Cordia RN on 09/21/2009   Method used:   [facility] to        [facility]  [phone]* (retail)       [address] [place],  [place]  [address]       Ph: [phone] or [phone]       Fax: [phone]   RxID:   [id number]

## 2010-03-27 NOTE — Progress Notes (Signed)
Summary: call-a-nurse  Phone Note Outgoing Call   Summary of Call: [facility] Triage Call Report Triage Record Num: [id number] Operator: Freddie Breech Patient Name: Renarda Mullinix Call Date & Time: 07/23/2009 9:19:34AM Patient Phone: [phone] PCP: Marga Melnick Patient Gender: Female PCP Fax : Patient DOB: 17-Mar-1945 Practice Name: [facility] Reason for Call: C/o sore throat, laryngitis onset 07/17/09, dry cough w/chest tightness onset 07/20/09. No active chest pain. Sore thoat pain has eased today. Homecare advised per cough protocol. Protocol(s) Used: Cough - Adult Recommended Outcome per Protocol: Provide Home/Self Care Reason for Outcome: Hoarseness (laryngitis) Care Advice:  ~ Use a cool mist humidifier to moisten air. Be sure to clean according to manufacturer's instructions. Suck on hard candy: Life Savers, lemon drops, etc. (sugar free if diabetic) or herbal throat lozenges may provide relief.  ~  ~ May inhale steam from hot shower or heated water. Be careful to avoid burns.  ~ All hoarseness/laryngitis that persists must be evaluated by provider.  ~ Call provider for evaluation of hoarseness that persists for 2 weeks or more.  ~ Anytime hoarseness is present, avoid talking and singing.  ~ SYMPTOM / CONDITION MANAGEMENT  Follow-up for Phone Call        PT COMING IN FOR OV today..................Marland KitchenFelecia Deloach CMA  Jul 25, 2009 8:20 AM

## 2010-03-27 NOTE — Assessment & Plan Note (Signed)
Summary: SORE THROAT//FD   Vital Signs:  Patient profile:   65 year old female Weight:      122.6 pounds BMI:     20.79 Temp:     98.3 degrees F oral Pulse rate:   64 / minute Resp:     17 per minute BP sitting:   128 / 76  (left arm) Cuff size:   regular  Vitals Entered By: Shonna Chock (Jul 25, 2009 12:07 PM) CC: Cough and diarrhea (patient had sore throat that has resolved) Comments REVIEWED MED LIST, PATIENT AGREED DOSE AND INSTRUCTION CORRECT  FYI: Patient will start the process for Kidney Transplant   CC:  Cough and diarrhea (patient had sore throat that has resolved).  History of Present Illness: Onset 07/18/2009 as ST followed by improvement for 2 days. On 05/26 she developed NP cough & chest tightness. Rx: fluids, Robitussin. A kidney transplant is being considered in [place] as per Dr Lowell Guitar "in a couple of years". GFR was 20 in 05/2009, but she is asymptomatic. Dr Roanna Banning 03/2009 OV reviewed. Frankly watery BMs X 3 days  w/o recent travel or antibiotic exposure.  Allergies: 1)  ! Sulfa  Review of Systems General:  Denies chills, fever, and sweats. ENT:  Denies ear discharge, earache, nasal congestion, sinus pressure, and sore throat; No frontal headache, facial pain or purulence. Resp:  Complains of cough and shortness of breath; denies coughing up blood, sputum productive, and wheezing; SOB wuth cough. GI:  Denies abdominal pain, bloody stools, and dark tarry stools; Watery stools X 3 days;no antibiotics for ? period(Amox Rxed in 10/2007). GU:  Denies discharge, dysuria, hematuria, and urinary frequency; Nocturia X 1. Allergy:  Denies itching eyes and sneezing.  Physical Exam  General:  in no acute distress; alert,appropriate and cooperative throughout examination Ears:  External ear exam shows no significant lesions or deformities.  Otoscopic examination reveals clear canals, tympanic membranes are intact bilaterally without bulging, retraction, inflammation or  discharge. Hearing is grossly normal bilaterally. TMs dull Nose:  External nasal examination shows no deformity or inflammation. Nasal mucosa are pink and moist without lesions or exudates. Slight septal deviation to R Mouth:  Oral mucosa and oropharynx without lesions or exudates.  Teeth in good repair. Slightly hoarse. Tongue moist Lungs:  Normal respiratory effort, chest expands symmetrically. Lungs are clear to auscultation, no crackles or wheezes. dry cough Heart:  Normal rate and regular rhythm. S1  normal without gallop, murmur, click, rub . S4; increased S2 Abdomen:  Bowel sounds positive,abdomen soft and non-tender without masses, organomegaly or hernias noted. Skin:  Intact without suspicious lesions or rashes. No tenting Cervical Nodes:  No lymphadenopathy noted Axillary Nodes:  No palpable lymphadenopathy   Impression & Recommendations:  Problem # 1:  BRONCHITIS-ACUTE (ICD-466.0)  The following medications were removed from the medication list:    Amoxicillin 500 Mg Tabs (Amoxicillin) .Marland KitchenMarland KitchenMarland KitchenMarland Kitchen 4 pills 30-60 min preop Her updated medication list for this problem includes:    Zithromax 250 Mg Tabs (Azithromycin) .Marland Kitchen... As per pack    Promethazine Vc/codeine 6.25-5-10 Mg/84ml Syrp (Phenyleph-promethazine-cod) .Marland Kitchen... 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 Take one tablet daily 2)  Estrace 0.5 Mg Tabs (Estradiol) 3)  Diovan 160 Mg Tabs (Valsartan) .Marland Kitchen.. 1 once daily in place of ace-i  4)  Pravastatin Sodium 20 Mg Tabs (Pravastatin sodium) .Marland Kitchen.. 1 at bedtime m,w & fri 5)  Diovan Hct 80-12.5 Mg Tabs (Valsartan-hydrochlorothiazide) .Marland Kitchen.. 1 by mouth once daily 6)  Zithromax 250 Mg Tabs (Azithromycin) .... As per pack 7)  Promethazine Vc/codeine 6.25-5-10 Mg/27ml Syrp (Phenyleph-promethazine-cod) .Marland Kitchen.. 1 tsp every 6  hrs as needed  for cough  Patient Instructions: 1)  Drink as much fluid as you can tolerate for the next few days. Align once daily until bowels are normal.Drink clear liquids only for the next 24 hours, then slowly add other liquids and food as you  tolerate them. Prescriptions: PROMETHAZINE VC/CODEINE 6.25-5-10 MG/5ML SYRP (PHENYLEPH-PROMETHAZINE-COD) 1 tsp every 6 hrs as needed  for cough  #120 cc x 0   Entered and Authorized by:   Marga Melnick MD   Signed by:   Marga Melnick MD on 07/25/2009   Method used:   Printed then faxed to ...       [facility]  [phone]* (retail)       [address] [place], [place]  [address]       Ph: [phone] or [phone]       Fax: [phone]   RxID:   [id number] ZITHROMAX 250 MG TABS (AZITHROMYCIN) as per pack  #1 x 0   Entered and Authorized by:   Marga Melnick MD   Signed by:   Marga Melnick MD on 07/25/2009   Method used:   Printed then faxed to ...       [facility]  [phone]* (retail)       [address] [place], [place]  [address]       Ph: [phone] or [phone]       Fax: [phone]   RxID:   [id number]

## 2010-03-29 NOTE — Letter (Signed)
Summary: [facility]  [facility]   Imported By: Lanelle Bal 02/21/2010 11:18:39  _____________________________________________________________________  External Attachment:    Type:   Image     Comment:   External Document

## 2010-03-29 NOTE — Assessment & Plan Note (Signed)
Summary: PNUEMONIA SHOT/NTA   Nurse Visit  CC: Pneumo vacc./kb   Allergies: 1)  ! Sulfa  Immunizations Administered:  Pneumonia Vaccine:    Vaccine Type: Pneumovax    Site: left deltoid    Mfr: Merck    Dose: 0.5 ml    Route: IM    Given by: Lucious Groves CMA    Exp. Date: 06/27/2011    Lot #: [id number]    VIS given: 01/30/09 version given March 09, 2010.  Orders Added: 1)  Pneumococcal Vaccine [90732] 2)  Admin 1st Vaccine [78295]

## 2010-04-17 ENCOUNTER — Encounter: Payer: Self-pay | Admitting: Internal Medicine

## 2010-05-18 ENCOUNTER — Encounter: Payer: Self-pay | Admitting: Internal Medicine

## 2010-05-22 ENCOUNTER — Encounter: Payer: Self-pay | Admitting: Internal Medicine

## 2010-07-13 NOTE — Assessment & Plan Note (Signed)
[facility]                        [facility] OFFICE NOTE   KAYAL, MULA                     MRN:          [id number]  DATE:05/14/2006                            DOB:          1945/07/18    OBJECTIVE:  Joe presents for CPX  She is essentially asymptomatic except for minor nail changes that have  been present for several months.  This has manifested from onycholysis  type change of the fingernails.  She denies any other hair or skin  change.  She has no temperature intolerance and has had no bowel  changesto suggest thyroid disorder.   PAST MEDICAL/SURGICAL HISTORY:  1. Hysterectomy for fibroids in 1987.  The ovaries were left.  2. Remotely she had tonsillectomy.  3. She has had cosmetic eye surgery in 1999.  4. She was hospitalized for diverticulitis in 1991.  Colonoscopy      reveals an insignificant polyp in 2002; she believes she is due for      a repeat study in 2012.  5. She does have a history of osteoporosis.  6. Hypertension and states that her blood pressure tends to average in      the range of 140/90 at home.  We will ask her to take her blood      pressure cuff to doctor's visits to verify that the cuff is working      properly.  7. Dyslipidemia in the past.  8. Mitral regurgitation was found on 2D echocardiogram.   FAMILY HISTORY:  Maternal side of the family has a strong family history  of myocardial infarction.  Father had a CNS aneurysm, died at age 23.  He had polycystic kidney disease.  There is a strong familial history on  the paternal side of the family.  Her sister also has polycystic kidney  disease.  She has seen Dr. Casimiro Needle in the past and he now follows  her every other year.  She states that he has discharged her from as of  her last visit.   SOCIAL HISTORY:  She quit smoking in 1980.  She drinks socially.  She  exercises in water aerobics two times a week and walks three to four  times a  week for 45 minutes with no cardiopulmonary symptoms.   REVIEW OF SYSTEMS:  The remainder of the review of systems was reviewed  in toto and is negative.   MEDICATIONS:  1. Atenolol 50 mg daily.  2. HCTZ 25 mg one-half daily.  3. Estrace, saline, baby aspirin and supplements.   ALLERGIES:  She is intolerant and allergic to SULFA AND ANY MYCINS.   PHYSICAL EXAMINATION:  GENERAL:  She is 5'5 and weighs 127, which is  down two pounds from February of 2007.  She appears younger than her  stated age.  VITAL SIGNS:  Pulse was 60 and regular, respiratory rate 14, blood  pressure 118/72.  She has no significant hypertensive changes on fundal  exam and only mild arterial narrowing.  HEENT:  Nares are patent, as are otic canals.  Dental hygiene is  excellent.  Thyroid slightly asymmetric but  no nodules are noted.  She  has no lymphadenopathy about the head, neck or medulla.  HEART:  I could not appreciate a significant murmur despite the history  of mitral regurgitation.  CHEST:  Clear to auscultation.  There is no increased work of breathing.  ABDOMEN:  There is dullness to percussion in both upper quadrants, more  so on the right.  I cannot specifically define hepatomegaly but I will  recommend an ultrasound.  The review of systems was negative for change  and color of the stool or urine.  She has had no history of hepatitis.  She has no abdominal tenderness.  MUSCULOSKELETAL:  She does have some crepitus of the knees; there are no  other significant musculoskeletal findings.  NEUROPSYCHIATRIC EXAM:  Negative.   LABORATORY DATA:  Her labs were reviewed.  The NMR of March 2007  suggests excessive carb intake.  I will recommend the [employer].com  article The Flat Belly Diet  to her with restriction of hyperglycemic  carbs.  Her LDL should be less than 100.  She has a protective HDL of  over 100.  Stool cards were collected.  She will be sent copies of her  labs to share with other  caregivers.  I do encourage her to establish  with a new gynecologist because her ovaries do remain and she is gravida  0, para 0, which might increase long-term risks.     Titus Dubin. Alwyn Ren, MD,FACP,FCCP  Electronically Signed    [facility]/MedQ  DD: 05/14/2006  DT: 05/14/2006  Job #: [id number]

## 2010-08-01 ENCOUNTER — Encounter (HOSPITAL_COMMUNITY): Payer: BC Managed Care – PPO

## 2010-08-01 ENCOUNTER — Other Ambulatory Visit: Payer: Self-pay | Admitting: Obstetrics and Gynecology

## 2010-08-01 LAB — COMPREHENSIVE METABOLIC PANEL
ALT: 15 U/L (ref 0–35)
AST: 24 U/L (ref 0–37)
CO2: 22 mEq/L (ref 19–32)
Chloride: 104 mEq/L (ref 96–112)
GFR calc Af Amer: 17 mL/min — ABNORMAL LOW (ref >60)
GFR calc non Af Amer: 14 mL/min — ABNORMAL LOW (ref >60)
Sodium: 137 mEq/L (ref 135–145)
Total Bilirubin: 0.2 mg/dL — ABNORMAL LOW (ref 0.3–1.2)

## 2010-08-01 LAB — CBC
Hemoglobin: 10.8 g/dL — ABNORMAL LOW (ref 12.0–15.0)
RBC: 3.5 MIL/uL — ABNORMAL LOW (ref 3.87–5.11)

## 2010-08-08 ENCOUNTER — Other Ambulatory Visit: Payer: Self-pay | Admitting: Obstetrics and Gynecology

## 2010-08-08 ENCOUNTER — Ambulatory Visit (HOSPITAL_COMMUNITY)
Admission: RE | Admit: 2010-08-08 | Discharge: 2010-08-08 | Disposition: A | Discharge status: Home / Self Care | Payer: BC Managed Care – PPO | Source: Ambulatory Visit | Attending: Obstetrics and Gynecology | Admitting: Obstetrics and Gynecology

## 2010-08-08 DIAGNOSIS — R143 Flatulence: Secondary | ICD-10-CM | POA: Insufficient documentation

## 2010-08-08 DIAGNOSIS — N83209 Unspecified ovarian cyst, unspecified side: Secondary | ICD-10-CM | POA: Insufficient documentation

## 2010-08-08 DIAGNOSIS — Z01812 Encounter for preprocedural laboratory examination: Secondary | ICD-10-CM | POA: Insufficient documentation

## 2010-08-08 DIAGNOSIS — R142 Eructation: Secondary | ICD-10-CM | POA: Insufficient documentation

## 2010-08-08 DIAGNOSIS — R141 Gas pain: Secondary | ICD-10-CM | POA: Insufficient documentation

## 2010-08-08 DIAGNOSIS — Z01818 Encounter for other preprocedural examination: Secondary | ICD-10-CM | POA: Insufficient documentation

## 2010-08-08 DIAGNOSIS — D279 Benign neoplasm of unspecified ovary: Secondary | ICD-10-CM | POA: Insufficient documentation

## 2010-09-07 NOTE — H&P (Addendum)
NAMEREILEY, BERTAGNOLLI NO.:  [id number]  MEDICAL RECORD NO.:  [id number]  LOCATION:  [facility]                           FACILITY:  [facility]  PHYSICIAN:  Carrington Clamp, M.D. DATE OF BIRTH:  10-09-1945  DATE OF ADMISSION:  08/01/2010 DATE OF DISCHARGE:                             HISTORY & PHYSICAL   CHIEF COMPLAINT:  This is a 65 year old G0, status post hysterectomy with an enlarging right and left ovarian cysts.  HISTORY OF PRESENT ILLNESS:  Ms. Pamela Mcknight is a longstanding patient of mine who came to see me in August 2011.  She was otherwise having no problems but she was found to have a 3 x 5 cm mobile, rubbery mass on the left adnexa.  The patient underwent an ultrasound at that time which revealed a right ovary 3.6 x 3.1 cm with a right follicle of 2.6 and the left ovary 2.6 cm with left follicle of 1.8.  There were several simple cystic areas without increased blood flow.  Her CA-125 was performed in September and found to be 16.9.  She was scheduled for a followup in 6 months.  In April 2012, she returned for a repeat ultrasound exam which revealed a increase in size of the left cyst to 1.9 and an increase of the right cyst to 3.3 cm.  There were still simple cysts bilaterally without increased blood flow, but the patient is now complaining of feeling bloated, early satiety, and increased diarrhea.  She did not complain of nausea, vomiting, pain, or problems with sexual intercourse.  The patient has a history of polycystic kidneys but that the pelvic kidneys were not seen on exam.  She elected to proceed with removal of the ovaries after risks, benefits, alternatives of all types of surgery were discussed with her.  We are going to proceed with a robotic-assisted BSO.  PAST MEDICAL HISTORY:  The patient has polycystic kidney disease, otherwise the kidneys have been functioning normally.  The patient also has hypertension.  PAST SURGICAL HISTORY:  The  patient had a total abdominal hysterectomy previously but the ovaries were retained.  PAST OBSTETRICAL HISTORY:  None.  PAST GYNECOLOGICAL HISTORY:  Negative for sexually transmitted diseases or pelvic infections.  TOBACCO:  None.  ALLERGIES:  SULFA and CONTRAST.  MEDICATIONS:  Please see med rec form.  PHYSICAL EXAMINATION:  VITAL SIGNS:  Blood pressure is 116/72. HEENT:  Anicteric without lymphadenopathy. HEART:  Regular rate and rhythm without murmurs, rubs, or gallops. LUNGS:  Clear to auscultation bilaterally. ABDOMEN:  Soft, nontender, and nondistended. PELVIC:  Normal external genitalia, normal vagina.  No cervix.  Uterus is felt.  There is a 3 x 5 cm mass in the right-hand side which is mobile and otherwise normal pelvic exam.  ULTRASOUND:  Left ovary is 2.9 cm x 2.1 with left follicle of 1.946 cm. Right ovary is 4.7 x 3.5 with a right follicle of 3.3 cm.  Simple cystic areas were seen bilaterally and there was no increased blood flow by color Doppler.  There were no other abnormalities or free fluid in the pelvis.  ASSESSMENT:  This is a 65 year old status post hysterectomy who has had mildly enlarging ovarian  cyst with a negative CA-125.  The case was discussed with Dr. Tacey Heap back in September 2011 and he had advised watching the cyst and did not believe that she needed a Gynecology/Oncology consult.  However, since then, the patient has complained of some worsening symptoms and the cysts have mild increase in small amount of size.  The patient strongly desires definitive therapy and has elected to have me proceed with a robotic-assisted bilateral salpingo-oophorectomy.  All risks, benefits, and alternatives have been discussed with the patient.  The patient received preoperative antibiotics and SCDs in the surgery.  The port placement has been discussed with the patient.  All risks, benefits, and alternatives have been discussed with the  patient.     Carrington Clamp, M.D.     MH/MEDQ  D:  08/07/2010  T:  08/08/2010  Job:  [id number]  Electronically Signed by Carrington Clamp MD on 09/07/2010 06:15:16 PM

## 2010-09-07 NOTE — Op Note (Signed)
Pamela Mcknight, ESCH NO.:  [id number]  MEDICAL RECORD NO.:  [id number]  LOCATION:  [facility]                          [facility]ACILITY:  [facility]  PHYSICIAN:  Carrington Clamp, M.D. DATE OF BIRTH:  [birth year]  DATE OF PROCEDURE:  08/08/2010 DATE OF DISCHARGE:                              OPERATIVE REPORT   PREOPERATIVE DIAGNOSES:  Bilateral ovarian cysts and postmenopausal, slightly enlarging and abdominal bloating.  POSTOPERATIVE DIAGNOSES:  Bilateral ovarian cysts and postmenopausal, slightly enlarging and abdominal bloating, small adhesions.  PROCEDURE:  Robotic BSO with lysis of adhesions.  SURGEON:  Carrington Clamp, MD  ASSISTANT:  Leilani Able, PA-C  ANESTHESIA:  General.  FINDINGS:  The right ovary is slightly larger than the left ovary with multiple cysts.  They all appear to be simple and all had clear fluid in them.  There were adhesions of the ovaries to the anterior peritoneum consistent with a prior hysterectomy.  There were no other masses or lesions seen.  There were small adhesions of the left descending bowel to the left peritoneum as well.  COMPLICATIONS:  None.  MEDICATIONS:  None.  COUNTS:  Correct x3.  TECHNIQUE:  After adequate general anesthesia was achieved, the patient was positioned, prepped and draped in the usual sterile fashion in the dorsal lithotomy position.  A sponge stick was placed in the vagina and red rubber placed in the urethra during the procedure.  A 2-cm incision was made in the inferior umbilicus and a Veress needle passed into the abdomen without aspiration of bowel contents or blood.  The abdomen was insufflated with CO2 and the 12-mm trocar passed without complication. The scope was placed inside the trocar and the two 8.5-mm ports were placed under direct visualization of the camera about 10 cm bilaterally and slightly inferior to the camera port.  The robot was then docked and the PK forceps was introduced on  two and the hot shears on one under direct visualization of the camera.  Pelvic washings were obtained just before the hot shears were introduced into the abdomen.  The ovaries were identified and so were the ureters bilaterally.  The ureters were coursing well out of the field of dissection during the entire operation.  On the left hand side, we were able to tent up the infundibulopelvic ligament and cauterized it with the PK forceps and then incised it with the hot shears.  Dissection with the PK forceps and the hot shears was then performed superficially to peel the right ovary off the peritoneum.  The right ureter came close to the area of dissection, but the area of dissection was so superficial that the ureter was seen coursing out of the field of dissection and also coursing and peristalsing well at the end of the case.  The same exact procedure was performed on the left hand side after adhesions of the left bowel had been able to be taken down from the peritoneum.  This allowed [place] to visualize the left ureter and the left infundibulopelvic ligament was then also cauterized with the PK forceps after being tented away from the rest of the pelvic structures.  The left ovary was again carefully dissected off the  peritoneum with both PK and hot shears.  The ureter was identified well out of the field of dissection for this part of the procedure.  The ureters were seen peristalsing bilaterally after both ovaries had been removed as well. An inspection of the rest of the pelvis revealed no other abnormalities and hemostasis was excellent once the gas had been turned down a little bit.  The instruments were then withdrawn and the robot undocked.  The 5- mm scope was placed through one of the 0.5-mm ports and a pair of forceps was used to grasp the left ovarian cyst and placed it in an Endobag that had been placed through the 12-mm port.  The Endobag was brought to the surface of the skin  and cut down while retaining the bag with several Tresa Endo.  A syringe was used to remove fluid from the cyst to decompress them and the left cyst was able to be removed entirely with the bag without any spill of contents into the abdomen.  The same exact procedure was performed on the right tube.  Each of these was sent separately to pathology.  The 12-mm trocar was placed back into the abdomen with the 10-mm scope and indicated that again everything was hemostatic and no other pathology was seen.  The instruments were then withdrawn from the abdomen.  The abdomen was desufflated.  The 12-mm trocar fascia was then closed with a figure-of-eight stitch of 2-0 Vicryl.  The skin incision was closed with stitches of 3-0 Vicryl Rapide plus Dermabond.  The instruments were withdrawn from the vagina, and the patient tolerated the procedure well and was returned to recovery room in stable condition.     Carrington Clamp, M.D.     MH/MEDQ  D:  08/08/2010  T:  08/09/2010  Job:  [id number]  Electronically Signed by Carrington Clamp MD on 09/07/2010 06:16:08 PM

## 2010-09-24 ENCOUNTER — Encounter: Payer: Self-pay | Admitting: Internal Medicine

## 2010-09-25 ENCOUNTER — Ambulatory Visit (INDEPENDENT_AMBULATORY_CARE_PROVIDER_SITE_OTHER): Payer: BC Managed Care – PPO | Admitting: Internal Medicine

## 2010-09-25 ENCOUNTER — Encounter: Payer: Self-pay | Admitting: Internal Medicine

## 2010-09-25 DIAGNOSIS — N189 Chronic kidney disease, unspecified: Secondary | ICD-10-CM

## 2010-09-25 DIAGNOSIS — Z23 Encounter for immunization: Secondary | ICD-10-CM

## 2010-09-25 DIAGNOSIS — Q613 Polycystic kidney, unspecified: Secondary | ICD-10-CM

## 2010-09-25 DIAGNOSIS — I10 Essential (primary) hypertension: Secondary | ICD-10-CM

## 2010-09-25 DIAGNOSIS — Z86010 Personal history of colonic polyps: Secondary | ICD-10-CM

## 2010-09-25 DIAGNOSIS — Z0000 Encounter for general adult medical examination without abnormal findings: Secondary | ICD-10-CM

## 2010-09-25 DIAGNOSIS — E785 Hyperlipidemia, unspecified: Secondary | ICD-10-CM

## 2010-09-25 NOTE — Patient Instructions (Addendum)
Please review Dr Gildardo Griffes book Eat, Drink & Be Healthy for dietary cholesterol information. Please  schedule fasting Labs : NMR Lipoprofile Lipid Panel  With TSH after stoppng statin &  4 months of dietary changes to optimally assess LDL risk.   The most common cause of elevated triglycerides is the ingestion of sugar from high fructose corn syrup sources. You should consume less than 30 grams  of sugar per day from foods and drinks with high fructose corn syrup as number 1, 2, 3, or #4 on the label. As TG go up, HDL or good cholesterol goes down. Also uric acid which causes gout will go up.

## 2010-09-25 NOTE — Progress Notes (Signed)
  Subjective:    Patient ID: Pamela Mcknight, female    DOB: 1945-11-17, 65 y.o.   MRN: [id number]  HPI  Pamela Mcknight  is here for a physical;acute issues include chronic nausea intermittently for 6 months with some anorexia & minimal weight loss.      Review of Systems Patient reports no vision/ hearing  changes, adenopathy,fever,  persistant / recurrent hoarseness , swallowing issues, chest pain,palpitations,edema,persistant /recurrent cough, hemoptysis, paroxysmal nocturnal dyspnea, gastrointestinal bleeding(melena, rectal bleeding), abdominal pain, significant heartburn,  bowel changes,GU symptoms(dysuria, hematuria,pyuria, incontinence ), Gyn symptoms(abnormal  bleeding , pain),  syncope, focal weakness, memory loss,numbness & tingling, skin/hair /nail changes,abnormal bruising or bleeding, anxiety,or depression.      Objective:   Physical Exam Gen.: Thin but healthy and well-nourished in appearance. Alert, appropriate and cooperative throughout exam. Head: Normocephalic without obvious abnormalities Eyes: No corneal or conjunctival inflammation noted. Pupils equal round reactive to light and accommodation. Fundal exam is benign without hemorrhages, exudate, papilledema. Extraocular motion intact. Vision grossly normal with lenses. Ears: External  ear exam reveals no significant lesions or deformities. Canals clear .TMs normal. Hearing is grossly normal bilaterally. Nose: External nasal exam reveals no deformity or inflammation. Nasal mucosa are pink and moist. No lesions or exudates noted. Septum  normal  Mouth: Oral mucosa and oropharynx reveal no lesions or exudates. Teeth in good repair. Neck: No deformities, masses, or tenderness noted. Range of motion & . Thyroid normal. Lungs: Normal respiratory effort; chest expands symmetrically. Lungs are clear to auscultation without rales, wheezes, or increased work of breathing. Heart: Normal rate and rhythm. Normal S1 and S2. No gallop, click, or rub.  Grade 1/6 systolic  murmur. Abdomen: Bowel sounds normal; abdomen soft and nontender. No masses, organomegaly or hernias noted. Genitalia: Dr Henderson Cloud   .                                                                                   Musculoskeletal/extremities: No deformity or scoliosis noted of  the thoracic or lumbar spine. No clubbing, cyanosis, edema  noted. Range of motion  normal .Tone & strength  normal.Joints :minor DIP OA changes. Nail health  good. Vascular: Carotid, radial artery, dorsalis pedis and  posterior tibial pulses are full and equal. No bruits present. Neurologic: Alert and oriented x3. Deep tendon reflexes symmetrical and normal.          Skin: Intact without suspicious lesions or rashes. Lymph: No cervical, axillary lymphadenopathy present. Psych: Mood and affect are normal. Normally interactive                                                                                         Assessment & Plan:   #1 comprehensive physical exam; no acute findings #2 see Problem List with Assessments & Recommendations Plan: see Orders

## 2010-10-09 ENCOUNTER — Telehealth: Payer: Self-pay | Admitting: Internal Medicine

## 2010-10-09 NOTE — Telephone Encounter (Signed)
See if an A1c to be done within next nephrology (renal) blood work  To assess Diabetes risk (790.29)

## 2010-10-09 NOTE — Telephone Encounter (Signed)
Spoke w/ pt informed that A1C can be checked when she comes in on Fri.

## 2010-10-12 ENCOUNTER — Ambulatory Visit (INDEPENDENT_AMBULATORY_CARE_PROVIDER_SITE_OTHER): Payer: BC Managed Care – PPO | Admitting: *Deleted

## 2010-10-12 DIAGNOSIS — R7309 Other abnormal glucose: Secondary | ICD-10-CM

## 2010-10-12 DIAGNOSIS — Z23 Encounter for immunization: Secondary | ICD-10-CM

## 2010-10-12 LAB — HEMOGLOBIN A1C: Hgb A1c MFr Bld: 5.4 % (ref 4.6–6.5)

## 2010-10-15 NOTE — Progress Notes (Signed)
  Subjective:    Patient ID: Pamela Mcknight, female    DOB: 1946-02-11, 65 y.o.   MRN: [id number]  HPI    Review of Systems     Objective:   Physical Exam        Assessment & Plan:

## 2010-10-18 ENCOUNTER — Other Ambulatory Visit: Payer: Self-pay

## 2010-10-23 ENCOUNTER — Encounter: Payer: Self-pay | Admitting: Vascular Surgery

## 2010-10-23 ENCOUNTER — Ambulatory Visit (INDEPENDENT_AMBULATORY_CARE_PROVIDER_SITE_OTHER): Payer: BC Managed Care – PPO | Admitting: Vascular Surgery

## 2010-10-23 VITALS — BP 152/87 | HR 82 | Resp 20 | Ht 64.0 in | Wt 123.6 lb

## 2010-10-23 DIAGNOSIS — N185 Chronic kidney disease, stage 5: Secondary | ICD-10-CM

## 2010-10-23 DIAGNOSIS — Z01810 Encounter for preprocedural cardiovascular examination: Secondary | ICD-10-CM

## 2010-10-23 DIAGNOSIS — N186 End stage renal disease: Secondary | ICD-10-CM

## 2010-10-23 NOTE — Progress Notes (Signed)
Study performed 10/23/2010 [facility] 

## 2010-10-23 NOTE — Progress Notes (Signed)
Subjective:     Patient ID: Pamela Mcknight, female   DOB: 1946/01/03, 65 y.o.   MRN: [id number]  HPI The patient is a 65 year old female presenting for evaluation of AV access for hemodialysis. She has polycystic kidney disease and is approaching need for renal replacement.  Review of Systems Negative for anorexia fever weight loss of vision or hearing change denies abdominal pain melena. GU no dysuria or hematuria vascular no joint pain or neurologic no skin lesions except for rash following Percocet used following surgery.    Past Medical History  Diagnosis Date  . Diverticulitis of colon 1991  . Polycystic kidney disease     Dr Casimiro Needle  . Hypertension   . Hyperlipidemia   . Colonic polyp     hyerplastic 2002; negative 2011, Dr Jarold Motto  . Mitral valve disorder     Mitral Regurgitation on 2 D ECHO    History  Substance Use Topics  . Smoking status: Former Smoker    Quit date: 02/25/1978  . Smokeless tobacco: Not on file   Comment: 30 years ago as of 2012  . Alcohol Use: Yes     rarely    Family History  Problem Relation Age of Onset  . Stroke Father   . Polycystic kidney disease Father     siblings   . Kidney disease Father     polycystic kidney disease  . Glaucoma Mother   . Hyperlipidemia Mother   . Macular degeneration Mother   . Polycystic kidney disease Paternal Uncle   . Polycystic kidney disease Paternal Grandmother   . Kidney disease Sister     polycystic kidney disease    Allergies  Allergen Reactions  . Percocet (Oxycodone-Acetaminophen) Rash  . Sulfonamide Derivatives     angioedema    Current outpatient prescriptions:aspirin 81 MG tablet, Take 81 mg by mouth daily.  , Disp: , Rfl: ;  Calcium-Vitamin D (CALTRATE 600 PLUS-VIT D PO), Take 1 tablet by mouth daily.  , Disp: , Rfl: ;  Cholecalciferol (VITAMIN D3) 2000 UNITS TABS, Take by mouth.  , Disp: , Rfl: ;  doxazosin (CARDURA) 1 MG tablet, Take 1 mg by mouth at bedtime.  , Disp: , Rfl: ;   estradiol (ESTRACE) 1 MG tablet, Take 1 mg by mouth daily.  , Disp: , Rfl:  fish oil-omega-3 fatty acids 1000 MG capsule, Take 1 g by mouth daily. , Disp: , Rfl: ;  Multiple Vitamins-Minerals (ICAPS PO), Take by mouth daily.  , Disp: , Rfl: ;  valsartan (DIOVAN) 80 MG tablet, Take 80 mg by mouth daily.  , Disp: , Rfl:   BP 152/87  Pulse 82  Resp 20  Ht 5\' 4"  (1.626 m)  Wt 123 lb 9.6 oz (56.065 kg)  BMI 21.22 kg/m2  Body mass index is 21.22 kg/(m^2).       Objective:   Physical Exam Although well-nourished white female in no acute distress. HEENT normal. Chest clear bilaterally. Heart regular rate and rhythm. 2+ radial pulses bilaterally. Abdomen nontender no masses noted. Musculoskeletal no major deformities or cyanosis. Neurologic grossly intact. She does have small cephalic vein at the wrist bilaterally and moderate antecubital veins bilaterally  Venous mapping of her left arm revealed patent cephalic vein throughout its course small it the wrist and moderate size at the antecubital space    Assessment:     Chronic renal insufficiency approaching need for hemodialysis    Plan:     I discussed options to include  hemodialysis catheter, AV fistula, and AV Gore-Tex graft. She does have acceptable size cephalic vein in the antecubital space on the left to attempt fistula. She understands the potential 9 maturation of this. We have scheduled her for outpatient surgery on 11/02/2010

## 2010-10-25 ENCOUNTER — Encounter: Payer: Self-pay | Admitting: Internal Medicine

## 2010-10-30 ENCOUNTER — Encounter: Payer: Self-pay | Admitting: Internal Medicine

## 2010-10-30 NOTE — Procedures (Unsigned)
CEPHALIC VEIN MAPPING  INDICATION:  Chronic kidney disease stage V.  HISTORY: Chronic kidney disease.  EXAM:  The right cephalic vein is not evaluated.  The right basilic vein is not evaluated.  The left cephalic vein is compressible.  Diameter measurements range from 0.27 cm to 0.47 cm.  The left basilic vein is compressible.  Diameter measurements range from 0.22 cm to 0.71 cm.  See attached worksheet for all measurements.  IMPRESSION:  Patent left cephalic and basilic veins with diameter measurements as described above.  ___________________________________________ Larina Earthly, M.D.  SH/MEDQ  D:  10/23/2010  T:  10/23/2010  Job:  [id number]

## 2010-10-31 ENCOUNTER — Other Ambulatory Visit (HOSPITAL_COMMUNITY): Payer: BC Managed Care – PPO

## 2010-10-31 ENCOUNTER — Encounter (HOSPITAL_COMMUNITY)
Admission: RE | Admit: 2010-10-31 | Discharge: 2010-10-31 | Disposition: A | Discharge status: Home / Self Care | Payer: BC Managed Care – PPO | Source: Ambulatory Visit | Attending: Vascular Surgery | Admitting: Vascular Surgery

## 2010-10-31 ENCOUNTER — Ambulatory Visit (HOSPITAL_COMMUNITY)
Admission: RE | Admit: 2010-10-31 | Discharge: 2010-10-31 | Disposition: A | Discharge status: Home / Self Care | Payer: BC Managed Care – PPO | Source: Ambulatory Visit | Attending: Vascular Surgery | Admitting: Vascular Surgery

## 2010-10-31 ENCOUNTER — Other Ambulatory Visit: Payer: Self-pay | Admitting: Vascular Surgery

## 2010-10-31 DIAGNOSIS — Z01812 Encounter for preprocedural laboratory examination: Secondary | ICD-10-CM | POA: Insufficient documentation

## 2010-10-31 DIAGNOSIS — N186 End stage renal disease: Secondary | ICD-10-CM | POA: Insufficient documentation

## 2010-10-31 DIAGNOSIS — R0602 Shortness of breath: Secondary | ICD-10-CM | POA: Insufficient documentation

## 2010-10-31 DIAGNOSIS — Z01818 Encounter for other preprocedural examination: Secondary | ICD-10-CM | POA: Insufficient documentation

## 2010-10-31 LAB — SURGICAL PCR SCREEN: Staphylococcus aureus: NEGATIVE

## 2010-11-02 ENCOUNTER — Ambulatory Visit (HOSPITAL_COMMUNITY)
Admission: RE | Admit: 2010-11-02 | Discharge: 2010-11-02 | Disposition: A | Discharge status: Home / Self Care | Payer: BC Managed Care – PPO | Source: Ambulatory Visit | Attending: Vascular Surgery | Admitting: Vascular Surgery

## 2010-11-02 DIAGNOSIS — N189 Chronic kidney disease, unspecified: Secondary | ICD-10-CM | POA: Insufficient documentation

## 2010-11-02 DIAGNOSIS — I059 Rheumatic mitral valve disease, unspecified: Secondary | ICD-10-CM | POA: Insufficient documentation

## 2010-11-02 DIAGNOSIS — Z01812 Encounter for preprocedural laboratory examination: Secondary | ICD-10-CM | POA: Insufficient documentation

## 2010-11-02 DIAGNOSIS — Z01818 Encounter for other preprocedural examination: Secondary | ICD-10-CM | POA: Insufficient documentation

## 2010-11-02 DIAGNOSIS — I129 Hypertensive chronic kidney disease with stage 1 through stage 4 chronic kidney disease, or unspecified chronic kidney disease: Secondary | ICD-10-CM | POA: Insufficient documentation

## 2010-11-02 DIAGNOSIS — N186 End stage renal disease: Secondary | ICD-10-CM

## 2010-11-02 HISTORY — PX: AV FISTULA PLACEMENT: SHX1204

## 2010-11-02 LAB — POCT I-STAT 4, (NA,K, GLUC, HGB,HCT)
Hemoglobin: 10.5 g/dL — ABNORMAL LOW (ref 12.0–15.0)
Potassium: 4.6 mEq/L (ref 3.5–5.1)
Sodium: 139 mEq/L (ref 135–145)

## 2010-11-13 NOTE — Op Note (Signed)
  [name], Pamela Mcknight              ACCOUNT NO.:  [id number]  MEDICAL RECORD NO.:  [id number]  LOCATION:  [facility]                         FACILITY:  [facility]  PHYSICIAN:  Larina Earthly, M.D.    DATE OF BIRTH:  1946/02/18  DATE OF PROCEDURE:  11/02/2010 DATE OF DISCHARGE:  11/02/2010                              OPERATIVE REPORT   PREOPERATIVE DIAGNOSIS:  Chronic renal insufficiency.  POSTOPERATIVE DIAGNOSIS:  Chronic renal insufficiency.  PROCEDURE:  Left upper arm arteriovenous fistula creation.  SURGEON:  Larina Earthly, MD  ASSISTANT:  Pecola Leisure, PA-C  ANESTHESIA:  MAC.  COMPLICATIONS:  None.  DISPOSITION:  To recovery room, stable.  PROCEDURE IN DETAIL:  The patient was taken to the operating room and placed in supine position where the area of the left arm was prepped and draped in usual sterile fashion.  Ultrasound imaging revealed very small cephalic vein at the level of the wrist.  She did have an adequate size vein at the antecubital space.  Using local anesthesia, incision was made across the antecubital space, carried down to isolate the cephalic vein, which was of good caliber and the brachial artery, which was also of good caliber.  Tributary branches were ligated with 3-0 and 4-0 silk ties and divided.  The basilic vein was left intact.  The vein was mobilized to the level of the brachial artery.  The brachial artery was occluded proximally and distally and was opened with 11 blade, and extended longitudinally with Potts scissors.  The vein was spatulated and sewn end-to-side to the artery with running 6-0 Prolene suture. Small arteriotomy was created.  The usual flushing maneuvers were undertaken.  The anastomosis completed and flow was restored to the vein.  The vein did have a good thrill.  The wound was irrigated with saline.  Hemostasis was obtained with electrocautery.  Wound was closed with 3-0 Vicryl in the subcutaneous and subcuticular tissue.   Benzoin and Steri-Strips were applied.     Larina Earthly, M.D.    TFE/MEDQ  D:  11/02/2010  T:  11/02/2010  Job:  [id number]  Electronically Signed by Scherrie Seneca M.D. on 11/13/2010 01:11:43 PM

## 2010-11-26 ENCOUNTER — Encounter: Payer: Self-pay | Admitting: Physician Assistant

## 2010-11-27 ENCOUNTER — Encounter: Payer: Self-pay | Admitting: Physician Assistant

## 2010-11-27 ENCOUNTER — Other Ambulatory Visit: Payer: Self-pay | Admitting: Nephrology

## 2010-11-27 ENCOUNTER — Encounter (HOSPITAL_COMMUNITY)
Admission: RE | Admit: 2010-11-27 | Discharge: 2010-11-27 | Disposition: A | Discharge status: Home / Self Care | Payer: BC Managed Care – PPO | Source: Ambulatory Visit | Attending: Nephrology | Admitting: Nephrology

## 2010-11-27 DIAGNOSIS — D638 Anemia in other chronic diseases classified elsewhere: Secondary | ICD-10-CM | POA: Insufficient documentation

## 2010-11-27 DIAGNOSIS — N184 Chronic kidney disease, stage 4 (severe): Secondary | ICD-10-CM | POA: Insufficient documentation

## 2010-11-28 ENCOUNTER — Encounter: Payer: Self-pay | Admitting: Physician Assistant

## 2010-11-28 ENCOUNTER — Ambulatory Visit (INDEPENDENT_AMBULATORY_CARE_PROVIDER_SITE_OTHER): Payer: BC Managed Care – PPO | Admitting: Physician Assistant

## 2010-11-28 VITALS — BP 149/73 | HR 102 | Resp 16 | Ht 64.0 in | Wt 127.4 lb

## 2010-11-28 DIAGNOSIS — N186 End stage renal disease: Secondary | ICD-10-CM

## 2010-11-28 NOTE — Progress Notes (Signed)
[facility]  Postoperative Visit hemodialysis access   Date of Surgery: 11/02/10 Surgeon: Early Nephrologist: Lowell Guitar  HPI: Pamela Mcknight is a 65 y.o. female who is 4 weeks S/P creation of left B-C AVF for Hemodialysis access. The patient denies symptoms of numbness, tingling, weakness and denies pain in the operative limb. Patient is here for post -op evaluation to assess healing and maturation of left AVF .  Pt is not on hemodialysis.     Past Medical History  Diagnosis Date  . Diverticulitis of colon 1991  . Polycystic kidney disease     Dr Casimiro Needle  . Hypertension   . Hyperlipidemia   . Colonic polyp     hyerplastic 2002; negative 2011, Dr Jarold Motto  . Mitral valve disorder     Mitral Regurgitation on 2 D ECHO    History  Substance Use Topics  . Smoking status: Former Smoker -- 1.0 packs/day for 10 years    Quit date: 02/25/1978  . Smokeless tobacco: Not on file   Comment: 30 years ago as of 2012  . Alcohol Use: Yes     rarely    Family History  Problem Relation Age of Onset  . Stroke Father   . Polycystic kidney disease Father     siblings   . Kidney disease Father     polycystic kidney disease  . Glaucoma Mother   . Hyperlipidemia Mother   . Macular degeneration Mother   . Polycystic kidney disease Paternal Uncle   . Polycystic kidney disease Paternal Grandmother   . Kidney disease Sister     polycystic kidney disease  . Hypertension Brother     Allergies  Allergen Reactions  . Percocet (Oxycodone-Acetaminophen) Rash  . Sulfonamide Derivatives     angioedema    Current outpatient prescriptions:aspirin 81 MG tablet, Take 81 mg by mouth daily.  , Disp: , Rfl: ;  Calcium-Vitamin D (CALTRATE 600 PLUS-VIT D PO), Take 1 tablet by mouth daily.  , Disp: , Rfl: ;  Cholecalciferol (VITAMIN D3) 2000 UNITS TABS, Take by mouth.  , Disp: , Rfl: ;  doxazosin (CARDURA) 4 MG tablet, Take 4 mg by mouth at bedtime.  , Disp: , Rfl: ;   estradiol (ESTRACE) 1 MG tablet, Take 1 mg by mouth daily.  , Disp: , Rfl:  fish oil-omega-3 fatty acids 1000 MG capsule, Take 1 g by mouth daily. , Disp: , Rfl: ;  Multiple Vitamins-Minerals (ICAPS PO), Take by mouth daily.  , Disp: , Rfl:    Physical Examination  Filed Vitals:   11/28/10 1405  BP: 149/73  Pulse: 102  Resp: 16    WDWN female in NAD.  left upper extremity Incision is clean, dry, intact 2+ left radial and ulnar  Skin color is normal, no cyanosis, jaundice, pallor or bruising, normal   Hand grip is 5/5 and sensation in digits is intact; There is a good  thrill and good  bruit in the AVF. The graft/fistula is easily palpable and of adequate size up to the level of the mid upper arm where the thrill diminishes and the vein becomes smaller  Assessment/Plan Pamela Mcknight is a 65 y.o. year old female who presents s/p creation of left B-C AVF Follow-up in 8 weeks to re-eval maturation of AVF  The patient's access will not be ready for use until after re-evaluation to ensure maturity.  Clinic MD: Edilia Bo

## 2010-12-12 ENCOUNTER — Other Ambulatory Visit: Payer: Self-pay | Admitting: Nephrology

## 2010-12-12 ENCOUNTER — Encounter (HOSPITAL_COMMUNITY): Payer: BC Managed Care – PPO

## 2010-12-12 LAB — IRON AND TIBC
Iron: 97 ug/dL (ref 42–135)
Saturation Ratios: 27 % (ref 20–55)
TIBC: 358 ug/dL (ref 250–470)
UIBC: 261 ug/dL (ref 125–400)

## 2010-12-12 LAB — FERRITIN: Ferritin: 87 ng/mL (ref 10–291)

## 2010-12-26 ENCOUNTER — Other Ambulatory Visit: Payer: Self-pay | Admitting: Nephrology

## 2010-12-26 ENCOUNTER — Encounter (HOSPITAL_COMMUNITY): Payer: BC Managed Care – PPO

## 2010-12-26 ENCOUNTER — Encounter (HOSPITAL_COMMUNITY): Payer: BC Managed Care – PPO | Attending: Nephrology

## 2010-12-26 DIAGNOSIS — N184 Chronic kidney disease, stage 4 (severe): Secondary | ICD-10-CM | POA: Insufficient documentation

## 2010-12-26 DIAGNOSIS — D638 Anemia in other chronic diseases classified elsewhere: Secondary | ICD-10-CM | POA: Insufficient documentation

## 2010-12-26 LAB — POCT HEMOGLOBIN-HEMACUE: Hemoglobin: 11.5 g/dL — ABNORMAL LOW (ref 12.0–15.0)

## 2011-01-02 ENCOUNTER — Ambulatory Visit (INDEPENDENT_AMBULATORY_CARE_PROVIDER_SITE_OTHER): Payer: BC Managed Care – PPO | Admitting: *Deleted

## 2011-01-02 VITALS — Temp 97.8°F

## 2011-01-02 DIAGNOSIS — Z23 Encounter for immunization: Secondary | ICD-10-CM

## 2011-01-07 ENCOUNTER — Other Ambulatory Visit (HOSPITAL_COMMUNITY): Payer: Self-pay | Admitting: *Deleted

## 2011-01-08 ENCOUNTER — Encounter (HOSPITAL_COMMUNITY)
Admission: RE | Admit: 2011-01-08 | Discharge: 2011-01-08 | Disposition: A | Discharge status: Home / Self Care | Payer: BC Managed Care – PPO | Source: Ambulatory Visit | Attending: Nephrology | Admitting: Nephrology

## 2011-01-08 DIAGNOSIS — N184 Chronic kidney disease, stage 4 (severe): Secondary | ICD-10-CM | POA: Insufficient documentation

## 2011-01-08 DIAGNOSIS — D638 Anemia in other chronic diseases classified elsewhere: Secondary | ICD-10-CM | POA: Insufficient documentation

## 2011-01-08 LAB — IRON AND TIBC: Saturation Ratios: 8 % — ABNORMAL LOW (ref 20–55)

## 2011-01-08 MED ORDER — EPOETIN ALFA 10000 UNIT/ML IJ SOLN: 20000.0000 [IU] | INTRAMUSCULAR | Status: DC

## 2011-01-09 LAB — POCT HEMOGLOBIN-HEMACUE: Hemoglobin: 12.1 g/dL (ref 12.0–15.0)

## 2011-01-10 DIAGNOSIS — D631 Anemia in chronic kidney disease: Secondary | ICD-10-CM | POA: Insufficient documentation

## 2011-01-10 DIAGNOSIS — N189 Chronic kidney disease, unspecified: Secondary | ICD-10-CM | POA: Insufficient documentation

## 2011-01-10 DIAGNOSIS — K5790 Diverticulosis of intestine, part unspecified, without perforation or abscess without bleeding: Secondary | ICD-10-CM | POA: Insufficient documentation

## 2011-01-10 DIAGNOSIS — I341 Nonrheumatic mitral (valve) prolapse: Secondary | ICD-10-CM | POA: Insufficient documentation

## 2011-01-15 ENCOUNTER — Other Ambulatory Visit (HOSPITAL_COMMUNITY): Payer: Self-pay | Admitting: *Deleted

## 2011-01-21 ENCOUNTER — Encounter (HOSPITAL_COMMUNITY): Payer: BC Managed Care – PPO

## 2011-01-21 ENCOUNTER — Encounter (HOSPITAL_COMMUNITY)
Admission: RE | Admit: 2011-01-21 | Discharge: 2011-01-21 | Disposition: A | Discharge status: Home / Self Care | Payer: BC Managed Care – PPO | Source: Ambulatory Visit | Attending: Nephrology | Admitting: Nephrology

## 2011-01-21 ENCOUNTER — Encounter: Payer: Self-pay | Admitting: Vascular Surgery

## 2011-01-21 MED ORDER — FERUMOXYTOL INJECTION 510 MG/17 ML
510.0000 mg | INTRAVENOUS | Status: DC
  Administered 2011-01-21: 510 mg via INTRAVENOUS

## 2011-01-21 MED ORDER — FERUMOXYTOL INJECTION 510 MG/17 ML
INTRAVENOUS | Status: AC
  Administered 2011-01-21: 510 mg via INTRAVENOUS
  Filled 2011-01-21: qty 17

## 2011-01-21 MED ORDER — EPOETIN ALFA 20000 UNIT/ML IJ SOLN
INTRAMUSCULAR | Status: AC
  Administered 2011-01-21: 20000 [IU] via SUBCUTANEOUS
  Filled 2011-01-21: qty 1

## 2011-01-21 MED ORDER — EPOETIN ALFA 10000 UNIT/ML IJ SOLN: 20000.0000 [IU] | INTRAMUSCULAR | Status: DC

## 2011-01-22 ENCOUNTER — Encounter: Payer: Self-pay | Admitting: Vascular Surgery

## 2011-01-22 ENCOUNTER — Encounter (HOSPITAL_COMMUNITY): Payer: BC Managed Care – PPO

## 2011-01-22 ENCOUNTER — Ambulatory Visit (INDEPENDENT_AMBULATORY_CARE_PROVIDER_SITE_OTHER): Payer: BC Managed Care – PPO | Admitting: Vascular Surgery

## 2011-01-22 VITALS — BP 174/81 | HR 95 | Resp 16 | Ht 64.0 in | Wt 119.0 lb

## 2011-01-22 DIAGNOSIS — N186 End stage renal disease: Secondary | ICD-10-CM

## 2011-01-22 NOTE — Progress Notes (Signed)
The patient presents for followup after her upper arm AV fistula creation by myself on 11/02/2010. She is not currently on hemodialysis. She has no steel symptoms.  Physical exam: Excellent maturation of her left upper arm AV fistula with good size and superficial location. She does have some tortuosity of this.  Impression and plan: Good maturation of left upper arm AV fistula. This should be acceptable for dialysis access when she initiates hemodialysis. She will see [place] again on an as-needed basis

## 2011-01-23 ENCOUNTER — Other Ambulatory Visit: Payer: Self-pay | Admitting: Obstetrics and Gynecology

## 2011-01-23 DIAGNOSIS — E785 Hyperlipidemia, unspecified: Secondary | ICD-10-CM

## 2011-01-23 DIAGNOSIS — E039 Hypothyroidism, unspecified: Secondary | ICD-10-CM

## 2011-01-23 DIAGNOSIS — T887XXA Unspecified adverse effect of drug or medicament, initial encounter: Secondary | ICD-10-CM

## 2011-01-25 ENCOUNTER — Other Ambulatory Visit (INDEPENDENT_AMBULATORY_CARE_PROVIDER_SITE_OTHER): Payer: BC Managed Care – PPO

## 2011-01-25 ENCOUNTER — Other Ambulatory Visit: Payer: Self-pay | Admitting: Internal Medicine

## 2011-01-25 DIAGNOSIS — T887XXA Unspecified adverse effect of drug or medicament, initial encounter: Secondary | ICD-10-CM

## 2011-01-25 DIAGNOSIS — E039 Hypothyroidism, unspecified: Secondary | ICD-10-CM

## 2011-01-25 DIAGNOSIS — E785 Hyperlipidemia, unspecified: Secondary | ICD-10-CM

## 2011-01-25 LAB — LIPID PANEL
Cholesterol: 233 mg/dL — ABNORMAL HIGH (ref 0–200)
HDL: 105.9 mg/dL (ref >39.00)
Total CHOL/HDL Ratio: 2
VLDL: 34.6 mg/dL (ref 0.0–40.0)

## 2011-01-25 NOTE — Progress Notes (Signed)
12  

## 2011-01-28 LAB — POCT HEMOGLOBIN-HEMACUE: Hemoglobin: 11.2 g/dL — ABNORMAL LOW (ref 12.0–15.0)

## 2011-01-29 ENCOUNTER — Encounter (HOSPITAL_COMMUNITY)
Admission: RE | Admit: 2011-01-29 | Discharge: 2011-01-29 | Disposition: A | Discharge status: Home / Self Care | Payer: BC Managed Care – PPO | Source: Ambulatory Visit | Attending: Nephrology | Admitting: Nephrology

## 2011-01-29 DIAGNOSIS — N184 Chronic kidney disease, stage 4 (severe): Secondary | ICD-10-CM | POA: Insufficient documentation

## 2011-01-29 DIAGNOSIS — D638 Anemia in other chronic diseases classified elsewhere: Secondary | ICD-10-CM | POA: Insufficient documentation

## 2011-01-29 MED ORDER — FERUMOXYTOL INJECTION 510 MG/17 ML
INTRAVENOUS | Status: AC
  Administered 2011-01-29: 510 mg via INTRAVENOUS
  Filled 2011-01-29: qty 17

## 2011-01-29 MED ORDER — FERUMOXYTOL INJECTION 510 MG/17 ML
510.0000 mg | INTRAVENOUS | Status: AC
  Administered 2011-01-29: 510 mg via INTRAVENOUS

## 2011-02-01 ENCOUNTER — Ambulatory Visit (INDEPENDENT_AMBULATORY_CARE_PROVIDER_SITE_OTHER): Payer: BC Managed Care – PPO

## 2011-02-01 DIAGNOSIS — Z23 Encounter for immunization: Secondary | ICD-10-CM

## 2011-02-03 NOTE — Progress Notes (Signed)
The most common cause of elevated triglycerides is the ingestion of sugar from high fructose corn syrup sources. You should consume less than 30 grams  of sugar per day from foods and drinks with high fructose corn syrup as number1, 2, 3, or #4 on the label.    LDL or BAD cholesterol is excellent ,  the HDL (GOOD) > 50  is protective. [employer]

## 2011-02-04 ENCOUNTER — Other Ambulatory Visit (HOSPITAL_COMMUNITY): Payer: Self-pay | Admitting: *Deleted

## 2011-02-04 ENCOUNTER — Telehealth: Payer: Self-pay

## 2011-02-04 NOTE — Telephone Encounter (Signed)
Message copied by Maurice Small on Mon Feb 04, 2011  5:05 PM ------      Message from: Pecola Lawless      Created: Sun Feb 03, 2011 10:10 AM       Please mail      ----- Message -----         From: SYSTEM         Sent: 01/30/2011  12:01 AM           To: Pecola Lawless, MD

## 2011-02-04 NOTE — Telephone Encounter (Signed)
NMR (special cholesterol profile) mailed to patient

## 2011-02-05 ENCOUNTER — Encounter (HOSPITAL_COMMUNITY)
Admission: RE | Admit: 2011-02-05 | Discharge: 2011-02-05 | Disposition: A | Discharge status: Home / Self Care | Payer: BC Managed Care – PPO | Source: Ambulatory Visit | Attending: Nephrology | Admitting: Nephrology

## 2011-02-05 ENCOUNTER — Telehealth: Payer: Self-pay

## 2011-02-05 LAB — IRON AND TIBC: Iron: 102 ug/dL (ref 42–135)

## 2011-02-05 NOTE — Telephone Encounter (Signed)
Dr.Hopper informed  

## 2011-02-05 NOTE — Telephone Encounter (Signed)
Low background peak-? If specimen frozen at some point. Unable to process NMR, recommend recollection.  Left message on voicemail for patient to return call when available

## 2011-02-05 NOTE — Telephone Encounter (Signed)
Spoke with patient, scheduled appointment for next Tuesday to redraw NMR, will forward to Dr.Hopper as a [place]

## 2011-02-11 ENCOUNTER — Other Ambulatory Visit: Payer: Self-pay | Admitting: Internal Medicine

## 2011-02-11 DIAGNOSIS — E785 Hyperlipidemia, unspecified: Secondary | ICD-10-CM

## 2011-02-12 ENCOUNTER — Other Ambulatory Visit: Payer: BC Managed Care – PPO

## 2011-02-12 DIAGNOSIS — E785 Hyperlipidemia, unspecified: Secondary | ICD-10-CM

## 2011-02-12 LAB — NMR LIPOPROFILE WITH LIPIDS

## 2011-02-22 ENCOUNTER — Encounter (HOSPITAL_COMMUNITY)
Admission: RE | Admit: 2011-02-22 | Discharge: 2011-02-22 | Disposition: A | Discharge status: Home / Self Care | Payer: BC Managed Care – PPO | Source: Ambulatory Visit | Attending: Nephrology | Admitting: Nephrology

## 2011-02-22 LAB — POCT HEMOGLOBIN-HEMACUE: Hemoglobin: 11.1 g/dL — ABNORMAL LOW (ref 12.0–15.0)

## 2011-02-22 MED ORDER — EPOETIN ALFA 20000 UNIT/ML IJ SOLN
INTRAMUSCULAR | Status: AC
  Filled 2011-02-22: qty 1

## 2011-02-22 MED ORDER — EPOETIN ALFA 10000 UNIT/ML IJ SOLN: 20000.0000 [IU] | INTRAMUSCULAR | Status: DC

## 2011-02-25 MED FILL — Epoetin Alfa Inj 20000 Unit/ML: INTRAMUSCULAR | Qty: 1 | Status: AC

## 2011-03-04 ENCOUNTER — Encounter: Payer: Self-pay | Admitting: Internal Medicine

## 2011-03-11 ENCOUNTER — Other Ambulatory Visit (HOSPITAL_COMMUNITY): Payer: Self-pay | Admitting: *Deleted

## 2011-03-12 ENCOUNTER — Encounter (HOSPITAL_COMMUNITY)
Admission: RE | Admit: 2011-03-12 | Discharge: 2011-03-12 | Disposition: A | Discharge status: Home / Self Care | Payer: BC Managed Care – PPO | Source: Ambulatory Visit | Attending: Nephrology | Admitting: Nephrology

## 2011-03-12 DIAGNOSIS — D638 Anemia in other chronic diseases classified elsewhere: Secondary | ICD-10-CM | POA: Insufficient documentation

## 2011-03-12 DIAGNOSIS — N184 Chronic kidney disease, stage 4 (severe): Secondary | ICD-10-CM | POA: Insufficient documentation

## 2011-03-12 LAB — IRON AND TIBC
Iron: 101 ug/dL (ref 42–135)
Saturation Ratios: 38 % (ref 20–55)
TIBC: 268 ug/dL (ref 250–470)
UIBC: 167 ug/dL (ref 125–400)

## 2011-03-12 MED ORDER — EPOETIN ALFA 10000 UNIT/ML IJ SOLN: 20000.0000 [IU] | INTRAMUSCULAR | Status: DC

## 2011-03-26 ENCOUNTER — Encounter (HOSPITAL_COMMUNITY)
Admission: RE | Admit: 2011-03-26 | Discharge: 2011-03-26 | Disposition: A | Discharge status: Home / Self Care | Payer: BC Managed Care – PPO | Source: Ambulatory Visit | Attending: Nephrology | Admitting: Nephrology

## 2011-03-26 LAB — POCT HEMOGLOBIN-HEMACUE: Hemoglobin: 12.1 g/dL (ref 12.0–15.0)

## 2011-03-26 MED ORDER — EPOETIN ALFA 10000 UNIT/ML IJ SOLN: 20000.0000 [IU] | INTRAMUSCULAR | Status: DC

## 2011-04-01 ENCOUNTER — Other Ambulatory Visit (HOSPITAL_COMMUNITY): Payer: Self-pay | Admitting: *Deleted

## 2011-04-09 ENCOUNTER — Encounter (HOSPITAL_COMMUNITY)
Admission: RE | Admit: 2011-04-09 | Discharge: 2011-04-09 | Disposition: A | Discharge status: Home / Self Care | Payer: BC Managed Care – PPO | Source: Ambulatory Visit | Attending: Nephrology | Admitting: Nephrology

## 2011-04-09 DIAGNOSIS — D638 Anemia in other chronic diseases classified elsewhere: Secondary | ICD-10-CM | POA: Insufficient documentation

## 2011-04-09 DIAGNOSIS — N184 Chronic kidney disease, stage 4 (severe): Secondary | ICD-10-CM | POA: Insufficient documentation

## 2011-04-09 LAB — IRON AND TIBC
Saturation Ratios: 30 % (ref 20–55)
UIBC: 186 ug/dL (ref 125–400)

## 2011-04-09 MED ORDER — EPOETIN ALFA 10000 UNIT/ML IJ SOLN: 20000.0000 [IU] | INTRAMUSCULAR | Status: DC

## 2011-04-09 MED ORDER — EPOETIN ALFA 20000 UNIT/ML IJ SOLN
INTRAMUSCULAR | Status: AC
  Administered 2011-04-09: 20000 [IU] via SUBCUTANEOUS
  Filled 2011-04-09: qty 1

## 2011-04-10 LAB — POCT HEMOGLOBIN-HEMACUE: Hemoglobin: 11.1 g/dL — ABNORMAL LOW (ref 12.0–15.0)

## 2011-04-23 ENCOUNTER — Encounter (HOSPITAL_COMMUNITY): Payer: BC Managed Care – PPO

## 2011-05-07 ENCOUNTER — Encounter (HOSPITAL_COMMUNITY)
Admission: RE | Admit: 2011-05-07 | Discharge: 2011-05-07 | Disposition: A | Discharge status: Home / Self Care | Payer: BC Managed Care – PPO | Source: Ambulatory Visit | Attending: Nephrology | Admitting: Nephrology

## 2011-05-07 DIAGNOSIS — D638 Anemia in other chronic diseases classified elsewhere: Secondary | ICD-10-CM | POA: Insufficient documentation

## 2011-05-07 DIAGNOSIS — N184 Chronic kidney disease, stage 4 (severe): Secondary | ICD-10-CM | POA: Insufficient documentation

## 2011-05-07 LAB — IRON AND TIBC
Iron: 122 ug/dL (ref 42–135)
TIBC: 271 ug/dL (ref 250–470)

## 2011-05-07 LAB — FERRITIN: Ferritin: 581 ng/mL — ABNORMAL HIGH (ref 10–291)

## 2011-05-07 MED ORDER — EPOETIN ALFA 10000 UNIT/ML IJ SOLN: 20000.0000 [IU] | INTRAMUSCULAR | Status: DC

## 2011-05-07 MED ORDER — EPOETIN ALFA 20000 UNIT/ML IJ SOLN
INTRAMUSCULAR | Status: AC
  Administered 2011-05-07: 20000 [IU] via SUBCUTANEOUS
  Filled 2011-05-07: qty 1

## 2011-05-11 DIAGNOSIS — Z09 Encounter for follow-up examination after completed treatment for conditions other than malignant neoplasm: Secondary | ICD-10-CM | POA: Diagnosis not present

## 2011-05-11 DIAGNOSIS — I059 Rheumatic mitral valve disease, unspecified: Secondary | ICD-10-CM | POA: Diagnosis not present

## 2011-05-11 DIAGNOSIS — Z87891 Personal history of nicotine dependence: Secondary | ICD-10-CM | POA: Diagnosis not present

## 2011-05-11 DIAGNOSIS — Z01810 Encounter for preprocedural cardiovascular examination: Secondary | ICD-10-CM | POA: Diagnosis not present

## 2011-05-11 DIAGNOSIS — Z940 Kidney transplant status: Secondary | ICD-10-CM | POA: Diagnosis not present

## 2011-05-11 DIAGNOSIS — D631 Anemia in chronic kidney disease: Secondary | ICD-10-CM | POA: Diagnosis present

## 2011-05-11 DIAGNOSIS — Z48298 Encounter for aftercare following other organ transplant: Secondary | ICD-10-CM | POA: Diagnosis not present

## 2011-05-11 DIAGNOSIS — N039 Chronic nephritic syndrome with unspecified morphologic changes: Secondary | ICD-10-CM | POA: Diagnosis present

## 2011-05-11 DIAGNOSIS — N186 End stage renal disease: Secondary | ICD-10-CM | POA: Diagnosis not present

## 2011-05-11 DIAGNOSIS — I10 Essential (primary) hypertension: Secondary | ICD-10-CM | POA: Diagnosis not present

## 2011-05-11 DIAGNOSIS — Q612 Polycystic kidney, adult type: Secondary | ICD-10-CM | POA: Diagnosis not present

## 2011-05-11 DIAGNOSIS — Q618 Other cystic kidney diseases: Secondary | ICD-10-CM | POA: Diagnosis not present

## 2011-05-11 DIAGNOSIS — K5730 Diverticulosis of large intestine without perforation or abscess without bleeding: Secondary | ICD-10-CM | POA: Diagnosis not present

## 2011-05-11 DIAGNOSIS — I120 Hypertensive chronic kidney disease with stage 5 chronic kidney disease or end stage renal disease: Secondary | ICD-10-CM | POA: Diagnosis not present

## 2011-05-11 DIAGNOSIS — N184 Chronic kidney disease, stage 4 (severe): Secondary | ICD-10-CM | POA: Diagnosis not present

## 2011-05-11 DIAGNOSIS — N185 Chronic kidney disease, stage 5: Secondary | ICD-10-CM | POA: Diagnosis not present

## 2011-05-11 DIAGNOSIS — D649 Anemia, unspecified: Secondary | ICD-10-CM | POA: Diagnosis not present

## 2011-05-11 DIAGNOSIS — N170 Acute kidney failure with tubular necrosis: Secondary | ICD-10-CM | POA: Diagnosis not present

## 2011-05-11 DIAGNOSIS — N2581 Secondary hyperparathyroidism of renal origin: Secondary | ICD-10-CM | POA: Diagnosis not present

## 2011-05-12 HISTORY — PX: KIDNEY TRANSPLANT: SHX239

## 2011-05-14 ENCOUNTER — Encounter: Payer: Self-pay | Admitting: Internal Medicine

## 2011-05-14 DIAGNOSIS — Z940 Kidney transplant status: Secondary | ICD-10-CM | POA: Insufficient documentation

## 2011-05-17 DIAGNOSIS — Z940 Kidney transplant status: Secondary | ICD-10-CM | POA: Insufficient documentation

## 2011-05-17 DIAGNOSIS — Z79899 Other long term (current) drug therapy: Secondary | ICD-10-CM | POA: Insufficient documentation

## 2011-05-21 ENCOUNTER — Encounter (HOSPITAL_COMMUNITY): Payer: BC Managed Care – PPO

## 2011-05-21 DIAGNOSIS — E119 Type 2 diabetes mellitus without complications: Secondary | ICD-10-CM | POA: Insufficient documentation

## 2011-06-04 ENCOUNTER — Encounter (HOSPITAL_COMMUNITY): Payer: Self-pay

## 2011-06-04 DIAGNOSIS — N184 Chronic kidney disease, stage 4 (severe): Secondary | ICD-10-CM | POA: Diagnosis not present

## 2011-06-04 DIAGNOSIS — Z940 Kidney transplant status: Secondary | ICD-10-CM | POA: Diagnosis not present

## 2011-06-06 DIAGNOSIS — N170 Acute kidney failure with tubular necrosis: Secondary | ICD-10-CM | POA: Diagnosis not present

## 2011-06-07 DIAGNOSIS — Z940 Kidney transplant status: Secondary | ICD-10-CM | POA: Diagnosis not present

## 2011-06-07 DIAGNOSIS — T8610 Unspecified complication of kidney transplant: Secondary | ICD-10-CM | POA: Diagnosis not present

## 2011-06-07 DIAGNOSIS — R188 Other ascites: Secondary | ICD-10-CM | POA: Diagnosis not present

## 2011-06-11 DIAGNOSIS — Z48298 Encounter for aftercare following other organ transplant: Secondary | ICD-10-CM | POA: Diagnosis not present

## 2011-06-11 DIAGNOSIS — N170 Acute kidney failure with tubular necrosis: Secondary | ICD-10-CM | POA: Diagnosis not present

## 2011-06-11 DIAGNOSIS — Z940 Kidney transplant status: Secondary | ICD-10-CM | POA: Diagnosis not present

## 2011-06-11 DIAGNOSIS — T8610 Unspecified complication of kidney transplant: Secondary | ICD-10-CM | POA: Diagnosis not present

## 2011-06-11 DIAGNOSIS — Z09 Encounter for follow-up examination after completed treatment for conditions other than malignant neoplasm: Secondary | ICD-10-CM | POA: Diagnosis not present

## 2011-06-18 DIAGNOSIS — D649 Anemia, unspecified: Secondary | ICD-10-CM | POA: Diagnosis not present

## 2011-06-18 DIAGNOSIS — Z79899 Other long term (current) drug therapy: Secondary | ICD-10-CM | POA: Diagnosis not present

## 2011-06-18 DIAGNOSIS — Z940 Kidney transplant status: Secondary | ICD-10-CM | POA: Diagnosis not present

## 2011-06-18 DIAGNOSIS — T8610 Unspecified complication of kidney transplant: Secondary | ICD-10-CM | POA: Diagnosis not present

## 2011-06-18 DIAGNOSIS — Z01818 Encounter for other preprocedural examination: Secondary | ICD-10-CM | POA: Diagnosis not present

## 2011-06-18 DIAGNOSIS — I10 Essential (primary) hypertension: Secondary | ICD-10-CM | POA: Diagnosis not present

## 2011-06-18 DIAGNOSIS — I059 Rheumatic mitral valve disease, unspecified: Secondary | ICD-10-CM | POA: Diagnosis not present

## 2011-06-18 DIAGNOSIS — K5730 Diverticulosis of large intestine without perforation or abscess without bleeding: Secondary | ICD-10-CM | POA: Diagnosis not present

## 2011-06-20 DIAGNOSIS — Z940 Kidney transplant status: Secondary | ICD-10-CM | POA: Diagnosis not present

## 2011-06-21 DIAGNOSIS — Z940 Kidney transplant status: Secondary | ICD-10-CM | POA: Diagnosis not present

## 2011-06-21 DIAGNOSIS — R188 Other ascites: Secondary | ICD-10-CM | POA: Diagnosis not present

## 2011-06-24 DIAGNOSIS — Z9489 Other transplanted organ and tissue status: Secondary | ICD-10-CM | POA: Diagnosis not present

## 2011-06-24 DIAGNOSIS — Z940 Kidney transplant status: Secondary | ICD-10-CM | POA: Diagnosis not present

## 2011-06-24 DIAGNOSIS — I10 Essential (primary) hypertension: Secondary | ICD-10-CM | POA: Diagnosis not present

## 2011-06-24 DIAGNOSIS — Z48298 Encounter for aftercare following other organ transplant: Secondary | ICD-10-CM | POA: Diagnosis not present

## 2011-06-24 DIAGNOSIS — Z79899 Other long term (current) drug therapy: Secondary | ICD-10-CM | POA: Diagnosis not present

## 2011-06-24 DIAGNOSIS — Z01818 Encounter for other preprocedural examination: Secondary | ICD-10-CM | POA: Diagnosis not present

## 2011-07-02 ENCOUNTER — Ambulatory Visit: Payer: BC Managed Care – PPO

## 2011-07-02 DIAGNOSIS — Z09 Encounter for follow-up examination after completed treatment for conditions other than malignant neoplasm: Secondary | ICD-10-CM | POA: Diagnosis not present

## 2011-07-02 DIAGNOSIS — Z940 Kidney transplant status: Secondary | ICD-10-CM | POA: Diagnosis not present

## 2011-07-03 ENCOUNTER — Ambulatory Visit (INDEPENDENT_AMBULATORY_CARE_PROVIDER_SITE_OTHER): Payer: Medicare Other

## 2011-07-03 DIAGNOSIS — Z23 Encounter for immunization: Secondary | ICD-10-CM | POA: Diagnosis not present

## 2011-07-16 DIAGNOSIS — I10 Essential (primary) hypertension: Secondary | ICD-10-CM | POA: Diagnosis not present

## 2011-07-16 DIAGNOSIS — E119 Type 2 diabetes mellitus without complications: Secondary | ICD-10-CM | POA: Diagnosis not present

## 2011-07-16 DIAGNOSIS — R7309 Other abnormal glucose: Secondary | ICD-10-CM | POA: Diagnosis not present

## 2011-07-16 DIAGNOSIS — I059 Rheumatic mitral valve disease, unspecified: Secondary | ICD-10-CM | POA: Diagnosis not present

## 2011-07-16 DIAGNOSIS — Z940 Kidney transplant status: Secondary | ICD-10-CM | POA: Diagnosis not present

## 2011-07-16 DIAGNOSIS — Z9489 Other transplanted organ and tissue status: Secondary | ICD-10-CM | POA: Diagnosis not present

## 2011-07-16 DIAGNOSIS — Z01818 Encounter for other preprocedural examination: Secondary | ICD-10-CM | POA: Diagnosis not present

## 2011-07-16 DIAGNOSIS — Z48298 Encounter for aftercare following other organ transplant: Secondary | ICD-10-CM | POA: Diagnosis not present

## 2011-07-16 DIAGNOSIS — Z79899 Other long term (current) drug therapy: Secondary | ICD-10-CM | POA: Diagnosis not present

## 2011-07-16 DIAGNOSIS — D649 Anemia, unspecified: Secondary | ICD-10-CM | POA: Diagnosis not present

## 2011-07-30 DIAGNOSIS — D649 Anemia, unspecified: Secondary | ICD-10-CM | POA: Diagnosis not present

## 2011-07-30 DIAGNOSIS — Z940 Kidney transplant status: Secondary | ICD-10-CM | POA: Diagnosis not present

## 2011-07-30 DIAGNOSIS — K5730 Diverticulosis of large intestine without perforation or abscess without bleeding: Secondary | ICD-10-CM | POA: Diagnosis not present

## 2011-07-30 DIAGNOSIS — Z79899 Other long term (current) drug therapy: Secondary | ICD-10-CM | POA: Diagnosis not present

## 2011-07-30 DIAGNOSIS — I059 Rheumatic mitral valve disease, unspecified: Secondary | ICD-10-CM | POA: Diagnosis not present

## 2011-07-30 DIAGNOSIS — Z01818 Encounter for other preprocedural examination: Secondary | ICD-10-CM | POA: Diagnosis not present

## 2011-07-30 DIAGNOSIS — E119 Type 2 diabetes mellitus without complications: Secondary | ICD-10-CM | POA: Diagnosis not present

## 2011-07-30 DIAGNOSIS — I10 Essential (primary) hypertension: Secondary | ICD-10-CM | POA: Diagnosis not present

## 2011-08-15 DIAGNOSIS — Z940 Kidney transplant status: Secondary | ICD-10-CM | POA: Diagnosis not present

## 2011-08-15 DIAGNOSIS — N182 Chronic kidney disease, stage 2 (mild): Secondary | ICD-10-CM | POA: Diagnosis not present

## 2011-09-11 DIAGNOSIS — Z01419 Encounter for gynecological examination (general) (routine) without abnormal findings: Secondary | ICD-10-CM | POA: Diagnosis not present

## 2011-09-26 DIAGNOSIS — N182 Chronic kidney disease, stage 2 (mild): Secondary | ICD-10-CM | POA: Diagnosis not present

## 2011-09-26 DIAGNOSIS — Z940 Kidney transplant status: Secondary | ICD-10-CM | POA: Diagnosis not present

## 2011-10-23 DIAGNOSIS — H2510 Age-related nuclear cataract, unspecified eye: Secondary | ICD-10-CM | POA: Diagnosis not present

## 2011-10-29 DIAGNOSIS — N183 Chronic kidney disease, stage 3 unspecified: Secondary | ICD-10-CM | POA: Diagnosis not present

## 2011-10-29 DIAGNOSIS — Z940 Kidney transplant status: Secondary | ICD-10-CM | POA: Diagnosis not present

## 2011-12-27 DIAGNOSIS — Z940 Kidney transplant status: Secondary | ICD-10-CM | POA: Diagnosis not present

## 2011-12-27 DIAGNOSIS — N183 Chronic kidney disease, stage 3 unspecified: Secondary | ICD-10-CM | POA: Diagnosis not present

## 2012-01-01 ENCOUNTER — Ambulatory Visit (INDEPENDENT_AMBULATORY_CARE_PROVIDER_SITE_OTHER): Payer: Medicare Other | Admitting: Internal Medicine

## 2012-01-01 ENCOUNTER — Encounter: Payer: Self-pay | Admitting: Internal Medicine

## 2012-01-01 VITALS — BP 116/72 | HR 92 | Temp 97.5°F | Resp 14 | Ht 63.5 in | Wt 111.2 lb

## 2012-01-01 DIAGNOSIS — Q613 Polycystic kidney, unspecified: Secondary | ICD-10-CM

## 2012-01-01 DIAGNOSIS — Z0000 Encounter for general adult medical examination without abnormal findings: Secondary | ICD-10-CM

## 2012-01-01 DIAGNOSIS — E785 Hyperlipidemia, unspecified: Secondary | ICD-10-CM

## 2012-01-01 NOTE — Progress Notes (Signed)
  Subjective:    Patient ID: Pamela Mcknight, female    DOB: [birth year], 66 y.o.   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 She did have hyperglycemia postoperatively. She has seen Dr. Lowell Guitar, her nephrologist recently. He had performed extensive lab studies which he reviewed with her.  Past medical history/family history/social history were all reviewed and updated.      Review of Systems Advanced cholesterol testing has documented that her LDL goal is less than 100, ideally less than 70. There is premature coronary artery disease in 2 maternal uncles. She is on modified heart healthy diet; she walks daily for 30 minutes. She denies associated chest pain, palpitations, dyspnea, claudication, or edema.  Her blood pressure has been well-controlled since surgery. She denies epistaxis or headaches.     Objective:   Physical Exam Gen.: Thin but healthy and well-nourished in appearance. Alert, appropriate and cooperative throughout exam. Head: Normocephalic without obvious abnormalities  Eyes: No corneal or conjunctival inflammation noted.  Pupils equal round reactive to light and accommodation. Fundal exam is benign without hemorrhages, exudate, papilledema. Extraocular motion intact. Vision grossly normal. Ears: External  ear exam reveals no significant lesions or deformities. Canals clear .TMs normal. Hearing is grossly normal bilaterally. Nose: External nasal exam reveals no deformity or inflammation. Nasal mucosa are pink and moist. No lesions or exudates noted.  Mouth: Oral mucosa and oropharynx reveal no lesions or exudates. Teeth in good repair. Neck: No deformities, masses, or tenderness noted. Range of motion & Thyroid normal. Lungs: Normal respiratory effort; chest expands symmetrically. Lungs are clear to auscultation without rales, wheezes, or increased work of breathing. Heart: Normal  rate and rhythm. Normal S1 and S2. No gallop, click, or rub. Grade 1/2-1 over 6 systolic murmur . Abdomen: Bowel sounds normal; abdomen soft and nontender. No masses, organomegaly or hernias noted. Genitalia: Dr Henderson Cloud, Gyn                                                Musculoskeletal/extremities: No  deformity or scoliosis noted of  the thoracic or lumbar spine. No clubbing, cyanosis, edema, or significant  deformity noted. Range of motion  normal .Tone & strength  normal.Joints normal. Nail health  Good.Minor DIP osteoarthritic finger changes  Vascular: Carotid, radial artery, dorsalis pedis and  posterior tibial pulses are full and equal. No bruits present. Neurologic: Alert and oriented x3. Deep tendon reflexes symmetrical and normal.          Skin: Intact without suspicious lesions or rashes. Lymph: No cervical, axillary lymphadenopathy present. Psych: Mood and affect are normal. Normally interactive                                                                                         Assessment & Plan:  #1 comprehensive physical exam; no acute findings  Plan: see Orders

## 2012-01-01 NOTE — Patient Instructions (Addendum)
Review and correct the record as indicated. Please share record with all medical staff seen.  Please  have fasting Labs : TSH,Lipids if not done by Dr Lowell Guitar. PLEASE BRING THESE INSTRUCTIONS TO FOLLOW UP  LAB APPOINTMENT.This will guarantee correct labs are drawn, eliminating need for repeat blood sampling ( needle sticks ! ). Diagnoses /Codes: Z61.0,960.4

## 2012-03-30 DIAGNOSIS — Z940 Kidney transplant status: Secondary | ICD-10-CM | POA: Diagnosis not present

## 2012-03-30 DIAGNOSIS — N183 Chronic kidney disease, stage 3 unspecified: Secondary | ICD-10-CM | POA: Diagnosis not present

## 2012-04-11 ENCOUNTER — Other Ambulatory Visit: Payer: Self-pay

## 2012-05-11 DIAGNOSIS — Z940 Kidney transplant status: Secondary | ICD-10-CM | POA: Diagnosis not present

## 2012-05-11 DIAGNOSIS — Z9489 Other transplanted organ and tissue status: Secondary | ICD-10-CM | POA: Diagnosis not present

## 2012-05-11 DIAGNOSIS — I10 Essential (primary) hypertension: Secondary | ICD-10-CM | POA: Diagnosis not present

## 2012-05-11 DIAGNOSIS — Z79899 Other long term (current) drug therapy: Secondary | ICD-10-CM | POA: Diagnosis not present

## 2012-05-11 DIAGNOSIS — K5730 Diverticulosis of large intestine without perforation or abscess without bleeding: Secondary | ICD-10-CM | POA: Diagnosis not present

## 2012-05-11 DIAGNOSIS — I059 Rheumatic mitral valve disease, unspecified: Secondary | ICD-10-CM | POA: Diagnosis not present

## 2012-05-13 DIAGNOSIS — Z1231 Encounter for screening mammogram for malignant neoplasm of breast: Secondary | ICD-10-CM | POA: Diagnosis not present

## 2012-05-25 DIAGNOSIS — Q828 Other specified congenital malformations of skin: Secondary | ICD-10-CM | POA: Diagnosis not present

## 2012-06-16 ENCOUNTER — Encounter: Payer: Self-pay | Admitting: Internal Medicine

## 2012-07-06 DIAGNOSIS — Z940 Kidney transplant status: Secondary | ICD-10-CM | POA: Diagnosis not present

## 2012-07-15 ENCOUNTER — Other Ambulatory Visit: Payer: Self-pay | Admitting: Dermatology

## 2012-07-15 DIAGNOSIS — D485 Neoplasm of uncertain behavior of skin: Secondary | ICD-10-CM | POA: Diagnosis not present

## 2012-07-15 DIAGNOSIS — D239 Other benign neoplasm of skin, unspecified: Secondary | ICD-10-CM | POA: Diagnosis not present

## 2012-07-15 DIAGNOSIS — L821 Other seborrheic keratosis: Secondary | ICD-10-CM | POA: Diagnosis not present

## 2012-09-28 DIAGNOSIS — Z1211 Encounter for screening for malignant neoplasm of colon: Secondary | ICD-10-CM | POA: Diagnosis not present

## 2012-09-30 ENCOUNTER — Other Ambulatory Visit: Payer: Self-pay

## 2012-10-08 DIAGNOSIS — N183 Chronic kidney disease, stage 3 unspecified: Secondary | ICD-10-CM | POA: Diagnosis not present

## 2012-10-16 DIAGNOSIS — L820 Inflamed seborrheic keratosis: Secondary | ICD-10-CM | POA: Diagnosis not present

## 2012-10-16 DIAGNOSIS — C44390 Other specified malignant neoplasm of skin of unspecified parts of face: Secondary | ICD-10-CM | POA: Diagnosis not present

## 2012-11-09 DIAGNOSIS — H2510 Age-related nuclear cataract, unspecified eye: Secondary | ICD-10-CM | POA: Diagnosis not present

## 2012-11-11 DIAGNOSIS — C44390 Other specified malignant neoplasm of skin of unspecified parts of face: Secondary | ICD-10-CM | POA: Diagnosis not present

## 2012-12-31 ENCOUNTER — Other Ambulatory Visit: Payer: Self-pay

## 2013-01-25 DIAGNOSIS — Z940 Kidney transplant status: Secondary | ICD-10-CM | POA: Diagnosis not present

## 2013-03-09 ENCOUNTER — Ambulatory Visit (INDEPENDENT_AMBULATORY_CARE_PROVIDER_SITE_OTHER): Payer: Medicare Other | Admitting: Family Medicine

## 2013-03-09 ENCOUNTER — Encounter: Payer: Self-pay | Admitting: Family Medicine

## 2013-03-09 VITALS — BP 124/84 | HR 81 | Temp 98.4°F | Resp 98 | Wt 114.5 lb

## 2013-03-09 DIAGNOSIS — J40 Bronchitis, not specified as acute or chronic: Secondary | ICD-10-CM | POA: Diagnosis not present

## 2013-03-09 MED ORDER — DOXYCYCLINE HYCLATE 100 MG PO TABS: 100.0000 mg | ORAL_TABLET | Freq: Two times a day (BID) | ORAL | Status: DC

## 2013-03-09 NOTE — Progress Notes (Signed)
Subjective:     Pamela Mcknight is a 68 y.o. female here for evaluation of a cough. Onset of symptoms was 1 week ago. Symptoms have been gradually worsening since that time. The cough is productive of green sputum and is aggravated by cold air and exercise. Associated symptoms include: postnasal drip, shortness of breath and sputum production. Patient does not have a history of asthma. Patient does not have a history of environmental allergens. Patient has not traveled recently. Patient does not have a history of smoking. Patient has not had a previous chest x-ray. Patient has not had a PPD done.  The following portions of the patient's history were reviewed and updated as appropriate: allergies, current medications, past family history, past medical history, past social history, past surgical history and problem list.  Review of Systems Pertinent items are noted in HPI.    Past Medical History  Diagnosis Date  . Diverticulitis of colon 1991  . Polycystic kidney disease     Dr Erling Cruz; S/P transplant  . Hypertension   . Hyperlipidemia   . Colonic polyp     hyerplastic 2002; negative 2011, Dr Sharlett Iles  . Mitral valve disorder     Mitral Regurgitation on 2 D ECHO    Current outpatient prescriptions:aspirin 81 MG tablet, Take 81 mg by mouth daily.  , Disp: , Rfl: ;  Cholecalciferol (VITAMIN D3) 2000 UNITS TABS, Take by mouth.  , Disp: , Rfl: ;  dapsone 25 MG tablet, Take 25 mg by mouth. 2 by mouth in the am, Disp: , Rfl: ;  estradiol (ESTRACE) 1 MG tablet, Take 1 mg by mouth daily.  , Disp: , Rfl: ;  fish oil-omega-3 fatty acids 1000 MG capsule, Take 1 g by mouth daily. , Disp: , Rfl:  Iron-Vitamins (GERITOL COMPLETE PO), Take by mouth., Disp: , Rfl: ;  magnesium oxide (MAG-OX) 400 MG tablet, Take 400 mg by mouth. 2 by mouth at noon, Disp: , Rfl: ;  metoprolol tartrate (LOPRESSOR) 25 MG tablet, Take 25 mg by mouth 2 (two) times daily., Disp: , Rfl: ;  mycophenolate (MYFORTIC) 180 MG EC  tablet, Take 180 mg by mouth. 2 by mouth in the am, 2 by mouth in the pm, Disp: , Rfl:  Probiotic Product (ALIGN PO), Take by mouth daily., Disp: , Rfl: ;  tacrolimus (PROGRAF) 1 MG capsule, Take 1 mg by mouth. 2 by mouth in the am, 2 by mouth in the pm, Disp: , Rfl: ;  doxycycline (VIBRA-TABS) 100 MG tablet, Take 1 tablet (100 mg total) by mouth 2 (two) times daily., Disp: 20 tablet, Rfl: 0 Objective:    Oxygen saturation 96% on room air BP 124/84  Pulse 81  Temp(Src) 98.4 F (36.9 C) (Oral)  Resp 98  Wt 114 lb 8 oz (51.937 kg)  SpO2 96% General appearance: alert, cooperative, appears stated age and no distress Ears: normal TM's and external ear canals both ears Nose: Nares normal. Septum midline. Mucosa normal. No drainage or sinus tenderness. Throat: lips, mucosa, and tongue normal; teeth and gums normal Neck: moderate anterior cervical adenopathy, supple, symmetrical, trachea midline and thyroid not enlarged, symmetric, no tenderness/mass/nodules Lungs: diminished breath sounds bilaterally and rhonchi bilaterally Heart: S1, S2 normal Extremities: extremities normal, atraumatic, no cyanosis or edema    Assessment:    Acute Bronchitis    Plan:    Antibiotics per medication orders. Antitussives per medication orders. Avoid exposure to tobacco smoke and fumes. Call if shortness of breath worsens,  blood in sputum, change in character of cough, development of fever or chills, inability to maintain nutrition and hydration. Avoid exposure to tobacco smoke and fumes. rto prn

## 2013-03-09 NOTE — Progress Notes (Signed)
Pre visit review using our clinic review tool, if applicable. No additional management support is needed unless otherwise documented below in the visit note. 

## 2013-03-09 NOTE — Patient Instructions (Signed)

## 2013-03-10 ENCOUNTER — Encounter: Payer: BC Managed Care – PPO | Admitting: Internal Medicine

## 2013-03-23 ENCOUNTER — Other Ambulatory Visit: Payer: Self-pay | Admitting: Dermatology

## 2013-03-23 DIAGNOSIS — D485 Neoplasm of uncertain behavior of skin: Secondary | ICD-10-CM | POA: Diagnosis not present

## 2013-03-23 DIAGNOSIS — D044 Carcinoma in situ of skin of scalp and neck: Secondary | ICD-10-CM | POA: Diagnosis not present

## 2013-05-17 DIAGNOSIS — I10 Essential (primary) hypertension: Secondary | ICD-10-CM | POA: Diagnosis not present

## 2013-05-17 DIAGNOSIS — Z792 Long term (current) use of antibiotics: Secondary | ICD-10-CM | POA: Diagnosis not present

## 2013-05-17 DIAGNOSIS — I059 Rheumatic mitral valve disease, unspecified: Secondary | ICD-10-CM | POA: Diagnosis not present

## 2013-05-17 DIAGNOSIS — Z9489 Other transplanted organ and tissue status: Secondary | ICD-10-CM | POA: Diagnosis not present

## 2013-05-17 DIAGNOSIS — K5730 Diverticulosis of large intestine without perforation or abscess without bleeding: Secondary | ICD-10-CM | POA: Diagnosis not present

## 2013-05-17 DIAGNOSIS — Z5181 Encounter for therapeutic drug level monitoring: Secondary | ICD-10-CM | POA: Diagnosis not present

## 2013-05-17 DIAGNOSIS — Z48298 Encounter for aftercare following other organ transplant: Secondary | ICD-10-CM | POA: Diagnosis not present

## 2013-05-17 DIAGNOSIS — E119 Type 2 diabetes mellitus without complications: Secondary | ICD-10-CM | POA: Diagnosis not present

## 2013-05-17 DIAGNOSIS — D899 Disorder involving the immune mechanism, unspecified: Secondary | ICD-10-CM | POA: Diagnosis not present

## 2013-05-17 DIAGNOSIS — Z298 Encounter for other specified prophylactic measures: Secondary | ICD-10-CM | POA: Diagnosis not present

## 2013-05-17 DIAGNOSIS — Z940 Kidney transplant status: Secondary | ICD-10-CM | POA: Diagnosis not present

## 2013-05-17 DIAGNOSIS — Z79899 Other long term (current) drug therapy: Secondary | ICD-10-CM | POA: Diagnosis not present

## 2013-05-18 DIAGNOSIS — C4499 Other specified malignant neoplasm of skin, unspecified: Secondary | ICD-10-CM | POA: Diagnosis not present

## 2013-05-18 DIAGNOSIS — D044 Carcinoma in situ of skin of scalp and neck: Secondary | ICD-10-CM | POA: Diagnosis not present

## 2013-05-25 DIAGNOSIS — I10 Essential (primary) hypertension: Secondary | ICD-10-CM | POA: Diagnosis not present

## 2013-05-25 DIAGNOSIS — N189 Chronic kidney disease, unspecified: Secondary | ICD-10-CM | POA: Diagnosis not present

## 2013-05-25 DIAGNOSIS — Q613 Polycystic kidney, unspecified: Secondary | ICD-10-CM | POA: Diagnosis not present

## 2013-06-06 ENCOUNTER — Encounter: Payer: Self-pay | Admitting: Internal Medicine

## 2013-06-06 DIAGNOSIS — C4492 Squamous cell carcinoma of skin, unspecified: Secondary | ICD-10-CM | POA: Insufficient documentation

## 2013-06-07 DIAGNOSIS — Z940 Kidney transplant status: Secondary | ICD-10-CM | POA: Diagnosis not present

## 2013-08-09 DIAGNOSIS — D239 Other benign neoplasm of skin, unspecified: Secondary | ICD-10-CM | POA: Diagnosis not present

## 2013-08-09 DIAGNOSIS — L821 Other seborrheic keratosis: Secondary | ICD-10-CM | POA: Diagnosis not present

## 2013-08-09 DIAGNOSIS — Z85828 Personal history of other malignant neoplasm of skin: Secondary | ICD-10-CM | POA: Diagnosis not present

## 2013-08-09 DIAGNOSIS — D485 Neoplasm of uncertain behavior of skin: Secondary | ICD-10-CM | POA: Diagnosis not present

## 2013-09-14 DIAGNOSIS — D0470 Carcinoma in situ of skin of unspecified lower limb, including hip: Secondary | ICD-10-CM | POA: Diagnosis not present

## 2013-10-11 DIAGNOSIS — Z940 Kidney transplant status: Secondary | ICD-10-CM | POA: Diagnosis not present

## 2013-10-19 ENCOUNTER — Other Ambulatory Visit: Payer: Self-pay | Admitting: Obstetrics and Gynecology

## 2013-10-19 DIAGNOSIS — Z01419 Encounter for gynecological examination (general) (routine) without abnormal findings: Secondary | ICD-10-CM | POA: Diagnosis not present

## 2013-10-19 DIAGNOSIS — Z124 Encounter for screening for malignant neoplasm of cervix: Secondary | ICD-10-CM | POA: Diagnosis not present

## 2013-10-20 LAB — CYTOLOGY - PAP

## 2013-11-11 DIAGNOSIS — H2510 Age-related nuclear cataract, unspecified eye: Secondary | ICD-10-CM | POA: Diagnosis not present

## 2013-11-18 DIAGNOSIS — I10 Essential (primary) hypertension: Secondary | ICD-10-CM | POA: Diagnosis not present

## 2013-11-19 DIAGNOSIS — H2510 Age-related nuclear cataract, unspecified eye: Secondary | ICD-10-CM | POA: Diagnosis not present

## 2013-11-24 DIAGNOSIS — Z23 Encounter for immunization: Secondary | ICD-10-CM | POA: Diagnosis not present

## 2013-11-24 DIAGNOSIS — H9190 Unspecified hearing loss, unspecified ear: Secondary | ICD-10-CM | POA: Diagnosis not present

## 2013-11-24 DIAGNOSIS — I10 Essential (primary) hypertension: Secondary | ICD-10-CM | POA: Diagnosis not present

## 2013-11-24 DIAGNOSIS — N189 Chronic kidney disease, unspecified: Secondary | ICD-10-CM | POA: Diagnosis not present

## 2013-11-24 DIAGNOSIS — Q613 Polycystic kidney, unspecified: Secondary | ICD-10-CM | POA: Diagnosis not present

## 2013-11-30 DIAGNOSIS — D485 Neoplasm of uncertain behavior of skin: Secondary | ICD-10-CM | POA: Diagnosis not present

## 2013-11-30 DIAGNOSIS — Z85828 Personal history of other malignant neoplasm of skin: Secondary | ICD-10-CM | POA: Diagnosis not present

## 2013-12-01 DIAGNOSIS — D0472 Carcinoma in situ of skin of left lower limb, including hip: Secondary | ICD-10-CM | POA: Diagnosis not present

## 2013-12-13 DIAGNOSIS — H25811 Combined forms of age-related cataract, right eye: Secondary | ICD-10-CM | POA: Diagnosis not present

## 2013-12-13 DIAGNOSIS — H2511 Age-related nuclear cataract, right eye: Secondary | ICD-10-CM | POA: Diagnosis not present

## 2013-12-20 DIAGNOSIS — H2512 Age-related nuclear cataract, left eye: Secondary | ICD-10-CM | POA: Diagnosis not present

## 2013-12-22 DIAGNOSIS — Z940 Kidney transplant status: Secondary | ICD-10-CM | POA: Diagnosis not present

## 2013-12-23 DIAGNOSIS — D0472 Carcinoma in situ of skin of left lower limb, including hip: Secondary | ICD-10-CM | POA: Diagnosis not present

## 2013-12-27 DIAGNOSIS — H25812 Combined forms of age-related cataract, left eye: Secondary | ICD-10-CM | POA: Diagnosis not present

## 2013-12-27 DIAGNOSIS — H2512 Age-related nuclear cataract, left eye: Secondary | ICD-10-CM | POA: Diagnosis not present

## 2014-02-10 ENCOUNTER — Other Ambulatory Visit: Payer: Self-pay | Admitting: Nurse Practitioner

## 2014-03-15 DIAGNOSIS — Z940 Kidney transplant status: Secondary | ICD-10-CM | POA: Diagnosis not present

## 2016-06-05 DIAGNOSIS — Z4822 Encounter for aftercare following kidney transplant: Secondary | ICD-10-CM | POA: Insufficient documentation

## 2016-06-05 DIAGNOSIS — Z79899 Other long term (current) drug therapy: Secondary | ICD-10-CM

## 2016-06-05 DIAGNOSIS — Z5181 Encounter for therapeutic drug level monitoring: Secondary | ICD-10-CM | POA: Insufficient documentation

## 2016-10-30 ENCOUNTER — Encounter: Payer: Self-pay | Admitting: Physician Assistant

## 2016-11-14 ENCOUNTER — Encounter: Payer: Self-pay | Admitting: Physician Assistant

## 2016-11-14 ENCOUNTER — Ambulatory Visit (INDEPENDENT_AMBULATORY_CARE_PROVIDER_SITE_OTHER): Payer: Medicare Other | Admitting: Physician Assistant

## 2016-11-14 ENCOUNTER — Telehealth: Payer: Self-pay | Admitting: *Deleted

## 2016-11-14 VITALS — BP 122/68 | HR 62 | Ht 64.0 in | Wt 108.0 lb

## 2016-11-14 DIAGNOSIS — R630 Anorexia: Secondary | ICD-10-CM

## 2016-11-14 DIAGNOSIS — K30 Functional dyspepsia: Secondary | ICD-10-CM | POA: Diagnosis not present

## 2016-11-14 DIAGNOSIS — R634 Abnormal weight loss: Secondary | ICD-10-CM

## 2016-11-14 MED ORDER — NYSTATIN 100000 UNIT/ML MT SUSP: OROMUCOSAL | 0 refills | Status: DC

## 2016-11-14 NOTE — Telephone Encounter (Signed)
Advised the patient to come to our lab tomorrow, 11-15-2016.  Amy has some labs she wanted to have the results for that Dr. Florene Glen didn't draw.

## 2016-11-14 NOTE — Patient Instructions (Addendum)
If you are age 69 or older, your body mass index should be between 23-30. Your Body mass index is 18.54 kg/m. If this is out of the aforementioned range listed, please consider follow up with your Primary Care Provider.  Push fluids, add Ensure or Boost 2 per day.   We have sent the following medications to your pharmacy for you to pick up at your convenience:  Myostatin oral suspension  Please discontinue taking Protonix  You have been scheduled for a CT scan of the abdomen and pelvis at [facility] ([address]---this is in the same building as Press [facility]).   You are scheduled on Tuesday, September 25th at 3:30pm. You should arrive 15 minutes prior to your appointment time for registration. Please follow the written instructions below on the day of your exam:  WARNING: IF YOU ARE ALLERGIC TO IODINE/X-RAY DYE, PLEASE NOTIFY RADIOLOGY IMMEDIATELY AT [phone]! YOU WILL BE GIVEN A 13 HOUR PREMEDICATION PREP.  1) Do not eat anything after 11:30am (4 hours prior to your test) Liquids are fine. 2) You have been given 2 bottles of oral contrast to drink. The solution may taste better if refrigerated, but do NOT add ice or any other liquid to this solution. Shake well before drinking.    Drink 1 bottle of contrast @ 1:30pm(2 hours prior to your exam)  Drink 1 bottle of contrast @ 2:30pm (1 hour prior to your exam)  You may take any medications as prescribed with a small amount of water except for the following: Metformin, Glucophage, Glucovance, Avandamet, Riomet, Fortamet, Actoplus Met, Janumet, Glumetza or Metaglip. The above medications must be held the day of the exam AND 48 hours after the exam.  The purpose of you drinking the oral contrast is to aid in the visualization of your intestinal tract. The contrast solution may cause some diarrhea. Before your exam is started, you will be given a small amount of fluid to drink. Depending on your individual set of  symptoms, you may also receive an intravenous injection of x-ray contrast/dye. Plan on being at [facility] for 30 minutes or longer, depending on the type of exam you are having performed.  This test typically takes 30-45 minutes to complete.  If you have any questions regarding your exam or if you need to reschedule, you may call the CT department at [phone] between the hours of 8:00 am and 5:00 pm, Monday-Friday.  ________________________________________________________________________

## 2016-11-14 NOTE — Progress Notes (Addendum)
Subjective:    Patient ID: Pamela Mcknight, female    DOB: February 07, 1946, 71 y.o.   MRN: [id number]  HPI Pamela Mcknight is a pleasant 71 year old white female, new to GI today referred by Dr. Pennie Banter office for further evaluation of abdominal pain and weight loss. She had been seen here in 2011 by Dr. Sharlett Iles and had colonoscopy which showed severe diverticulosis, no polyps. She has history of hypertension, polycystic kidney disease and is status post renal transplant in 2013 She is maintained on Prograf, and Myfortic. She is followed by Dr. Powell/nephrology in [place]. She says her current symptoms have been present over the past couple of months and worse over the past 2 weeks. She says she has absolutely no appetite and has lost about 9 pounds, most of these over the past few weeks. She has no complaints of nausea or vomiting. She has a general feeling of discomfort in her abdomen which she describes as "icky" and bloated. She says when she eats she develops increased symptoms of upset stomach which has not been severe. Her energy level has been significantly decreased and she complains of weakness which is worse in the morning and any fever chills or sweats. She's not having any ongoing issues with diarrhea she says once every couple of weeks she might have a an episode of diarrhea but that has been her long-term pattern. No melena or hematochezia. Exline She has not had any recent changes in medications. She says that she had labs done by Dr. Florene Glen within the past week. She also states that she has had a Prograf level checked and that this was fine. She has been started on Protonix by primary care with no change thus far.  Review of Systems Pertinent positive and negative review of systems were noted in the above HPI section.  All other review of systems was otherwise negative.  Outpatient Encounter Prescriptions as of 11/14/2016  Medication Sig  . aspirin 81 MG tablet Take 81 mg by mouth daily.      . Cholecalciferol (VITAMIN D3) 2000 UNITS TABS Take by mouth.    . estradiol (ESTRACE) 1 MG tablet Take 1 mg by mouth daily.    . fish oil-omega-3 fatty acids 1000 MG capsule Take 1 g by mouth daily.   . metoprolol tartrate (LOPRESSOR) 25 MG tablet Take 25 mg by mouth 2 (two) times daily.  . mycophenolate (MYFORTIC) 180 MG EC tablet Take 180 mg by mouth. 2 by mouth in the am, 2 by mouth in the pm  . pantoprazole (PROTONIX) 40 MG tablet Take 40 mg by mouth daily.  . Probiotic Product (ALIGN PO) Take by mouth daily.  . tacrolimus (PROGRAF) 1 MG capsule Take 1 mg by mouth. 2 by mouth in the am, 2 by mouth in the pm  . [DISCONTINUED] doxycycline (VIBRA-TABS) 100 MG tablet Take 1 tablet (100 mg total) by mouth 2 (two) times daily.  . [DISCONTINUED] Iron-Vitamins (GERITOL COMPLETE PO) Take by mouth.  . [DISCONTINUED] magnesium oxide (MAG-OX) 400 MG tablet Take 400 mg by mouth. 2 by mouth at noon  . dapsone 25 MG tablet Take 25 mg by mouth. 2 by mouth in the am  . nystatin (MYCOSTATIN) 100000 UNIT/ML suspension Take 5 cc's by mouth, swish and swallow 4 times daily for 14 days.   No facility-administered encounter medications on file as of 11/14/2016.    Allergies  Allergen Reactions  . Contrast Media [Iodinated Diagnostic Agents] Rash  . Percocet [Oxycodone-Acetaminophen] Rash  .  Sulfonamide Derivatives     angioedema   Patient Active Problem List   Diagnosis Date Noted  . Squamous cell skin cancer 06/06/2013  . RENAL INSUFFICIENCY, CHRONIC 08/17/2009  . HYPERLIPIDEMIA 07/04/2008  . HYPERTENSION 07/04/2008  . COLONIC POLYPS, HX OF 07/04/2008  . DIVERTICULOSIS, COLON 07/22/2007  . POLYCYSTIC KIDNEY DISEASE 07/22/2007  . DIVERTICULITIS, HX OF 07/22/2007   Social History   Social History  . Marital status: Single    Spouse name: N/A  . Number of children: N/A  . Years of education: N/A   Occupational History  . Not on file.   Social History Main Topics  . Smoking status: Former  Smoker    Packs/day: 1.00    Years: 10.00    Quit date: 02/25/1978  . Smokeless tobacco: Never Used     Comment: 30 years ago as of 2012  . Alcohol use Yes     Comment: rarely  . Drug use: No  . Sexual activity: Not on file   Other Topics Concern  . Not on file   Social History Narrative   Regular exercise- no     Ms. Bellis's family history includes Glaucoma in her mother; Heart attack in her maternal aunt and maternal uncle; Hyperlipidemia in her mother; Hypertension in her brother; Macular degeneration in her mother; Osteoarthritis in her mother; Polycystic kidney disease in her father, paternal grandmother, paternal uncle, and sister; Stroke (age of onset: 39) in her father.      Objective:    Vitals:   11/14/16 0953  BP: 122/68  Pulse: 62    Physical Exam well-developed elderly white female in no acute distress, accompanied by family member. Blood pressure 122/68 pulse 62, height 5 foot 4, weight 108, BMI 18.5. HEENT ;nontraumatic normocephalic EOMI PERRLA sclera anicteric, Cardiovascular ;regular rate and rhythm with S1-S2 no murmur or gallop, Pulmonary; clear bilaterally, Abdomen ;soft, bowel sounds are present there is no palpable mass or hepatosplenomegaly, Rectal ;exam not done, Extremities ;no clubbing cyanosis or edema skin warm and dry, Neuropsych ;mood and affect appropriate       Assessment & Plan:   #21 71 year old white female with polycystic any disease status post renal transplant 2013, chronically immunosuppressed on Prograf and Myfortic with 2 month history of anorexia, and upper and mid abdominal discomfort, she has had an associated 9 pound weight loss most of that in the past few weeks. Apparently Prograf dose was lowered at one point over the last couple of months with no change in symptoms and then resumed her usual dose #2 hypertension #3 hyperlipidemia #4 diverticulosis  Plan;For now will continue Protonix 40 mg by mouth every morning Advised  patient to push her fluids and to eat several times per day, with smaller meals/snacks of  BLAND food. We will obtain copies of her recent labs done by her per nephrology, further labs if indicated. Patient will be scheduled for CT scan of the abdomen and pelvis. If CT is unrevealing she will need EGD with Dr. Carlean Purl who she has requested to establish with.   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  Gatha Mayer, MD, [facility]

## 2016-11-15 ENCOUNTER — Other Ambulatory Visit (INDEPENDENT_AMBULATORY_CARE_PROVIDER_SITE_OTHER): Payer: Medicare Other

## 2016-11-15 DIAGNOSIS — K30 Functional dyspepsia: Secondary | ICD-10-CM

## 2016-11-15 DIAGNOSIS — R634 Abnormal weight loss: Secondary | ICD-10-CM | POA: Diagnosis not present

## 2016-11-15 DIAGNOSIS — R630 Anorexia: Secondary | ICD-10-CM

## 2016-11-15 LAB — SEDIMENTATION RATE: SED RATE: 4 mm/h (ref 0–30)

## 2016-11-15 LAB — CBC WITH DIFFERENTIAL/PLATELET
Basophils Absolute: 0 10*3/uL (ref 0.0–0.1)
Basophils Relative: 0.6 % (ref 0.0–3.0)
EOS ABS: 0.1 10*3/uL (ref 0.0–0.7)
Eosinophils Relative: 1.7 % (ref 0.0–5.0)
HCT: 38.6 % (ref 36.0–46.0)
HEMOGLOBIN: 12.6 g/dL (ref 12.0–15.0)
Lymphocytes Relative: 12.4 % (ref 12.0–46.0)
Lymphs Abs: 0.8 10*3/uL (ref 0.7–4.0)
MCHC: 32.7 g/dL (ref 30.0–36.0)
MCV: 94.4 fl (ref 78.0–100.0)
MONO ABS: 0.6 10*3/uL (ref 0.1–1.0)
Monocytes Relative: 10.4 % (ref 3.0–12.0)
Neutro Abs: 4.6 10*3/uL (ref 1.4–7.7)
Neutrophils Relative %: 74.9 % (ref 43.0–77.0)
Platelets: 260 10*3/uL (ref 150.0–400.0)
RBC: 4.09 Mil/uL (ref 3.87–5.11)
RDW: 12.9 % (ref 11.5–15.5)
WBC: 6.2 10*3/uL (ref 4.0–10.5)

## 2016-11-15 LAB — HEPATIC FUNCTION PANEL
ALT: 7 U/L (ref 0–35)
AST: 12 U/L (ref 0–37)
Albumin: 4.2 g/dL (ref 3.5–5.2)
Alkaline Phosphatase: 25 U/L — ABNORMAL LOW (ref 39–117)
BILIRUBIN DIRECT: 0.1 mg/dL (ref 0.0–0.3)
BILIRUBIN TOTAL: 0.9 mg/dL (ref 0.2–1.2)
Total Protein: 7 g/dL (ref 6.0–8.3)

## 2016-11-15 LAB — HIGH SENSITIVITY CRP: CRP, High Sensitivity: 0.33 mg/L (ref 0.000–5.000)

## 2016-11-19 ENCOUNTER — Ambulatory Visit (INDEPENDENT_AMBULATORY_CARE_PROVIDER_SITE_OTHER)
Admission: RE | Admit: 2016-11-19 | Discharge: 2016-11-19 | Disposition: A | Discharge status: Home / Self Care | Payer: Medicare Other | Source: Ambulatory Visit | Attending: Physician Assistant | Admitting: Physician Assistant

## 2016-11-19 DIAGNOSIS — R634 Abnormal weight loss: Secondary | ICD-10-CM

## 2016-11-19 DIAGNOSIS — K30 Functional dyspepsia: Secondary | ICD-10-CM

## 2016-11-22 ENCOUNTER — Telehealth: Payer: Self-pay | Admitting: Physician Assistant

## 2016-11-22 NOTE — Telephone Encounter (Signed)
Advised patient the CT results have not been reviewed by her provider. I will call her as soon as they are. I did tell her no masses, cancer or infection is mentioned in the report.

## 2016-11-25 ENCOUNTER — Telehealth: Payer: Self-pay

## 2016-11-25 NOTE — Telephone Encounter (Signed)
-----   Message from Alfredia Ferguson, PA-C sent at 11/22/2016  1:25 PM EDT ----- Please let pt know the CT scan shows multiple renal cysts and multiple liver cysts, and multiple stones up in kidneys . Marland Kitchen No other causes of abdominal  pain found. See how she is feeling-? Still having pain and nausea?

## 2016-11-25 NOTE — Telephone Encounter (Signed)
She reports she is "better" but her abdominal pain is the same and she continues to lose weight. She "has more energy." She would like to know "what now?"

## 2016-11-26 NOTE — Telephone Encounter (Signed)
I think she should have EGD next - please schedule with Dr Carlean Purl

## 2016-11-26 NOTE — Telephone Encounter (Signed)
Discussed with the patient. She is "wait listed" but also scheduled for an EGD 01/01/17 with Dr Carlean Purl for her abdominal pain.

## 2016-12-02 ENCOUNTER — Ambulatory Visit (AMBULATORY_SURGERY_CENTER): Payer: Self-pay | Admitting: *Deleted

## 2016-12-02 VITALS — Ht 64.0 in | Wt 107.6 lb

## 2016-12-02 DIAGNOSIS — N951 Menopausal and female climacteric states: Secondary | ICD-10-CM | POA: Insufficient documentation

## 2016-12-02 DIAGNOSIS — K30 Functional dyspepsia: Secondary | ICD-10-CM

## 2016-12-02 DIAGNOSIS — R634 Abnormal weight loss: Secondary | ICD-10-CM

## 2016-12-02 NOTE — Progress Notes (Signed)
Pt's friend present during [place]. Patient denies any allergies to eggs or soy. Patient denies any problems with anesthesia/sedation. Patient denies any oxygen use at home. Patient denies taking any diet/weight loss medications or blood thinners. EMMI education assisgned to patient on EGD, this was explained and instructions given to patient.

## 2016-12-04 ENCOUNTER — Ambulatory Visit (AMBULATORY_SURGERY_CENTER): Payer: Medicare Other | Admitting: Internal Medicine

## 2016-12-04 ENCOUNTER — Encounter: Payer: Self-pay | Admitting: Internal Medicine

## 2016-12-04 VITALS — BP 157/69 | HR 57 | Temp 97.8°F | Resp 13 | Ht 64.0 in | Wt 107.0 lb

## 2016-12-04 DIAGNOSIS — R634 Abnormal weight loss: Secondary | ICD-10-CM

## 2016-12-04 DIAGNOSIS — R109 Unspecified abdominal pain: Secondary | ICD-10-CM

## 2016-12-04 MED ORDER — SODIUM CHLORIDE 0.9 % IV SOLN: 500.0000 mL | INTRAVENOUS | Status: DC

## 2016-12-04 MED ORDER — BUSPIRONE HCL 10 MG PO TABS: 10.0000 mg | ORAL_TABLET | Freq: Two times a day (BID) | ORAL | 2 refills | Status: DC

## 2016-12-04 NOTE — Progress Notes (Signed)
Pt's states no medical or surgical changes since previsit or office visit. 

## 2016-12-04 NOTE — Progress Notes (Signed)
Report given to PACU, vss 

## 2016-12-04 NOTE — Op Note (Signed)
[facility] Patient Name: Darren Nodal Procedure Date: 12/04/2016 2:31 PM MRN: [id number] Endoscopist: Gatha Mayer , MD Age: 71 Referring MD:  Date of Birth: 1945/09/18 Gender: Female Account #: [id number] Procedure:                Upper GI endoscopy Indications:              Epigastric abdominal pain, Dyspepsia, Early                            satiety, Weight loss Medicines:                Propofol per Anesthesia, Monitored Anesthesia Care Procedure:                Pre-Anesthesia Assessment:                           - Prior to the procedure, a History and Physical                            was performed, and patient medications and                            allergies were reviewed. The patient's tolerance of                            previous anesthesia was also reviewed. The risks                            and benefits of the procedure and the sedation                            options and risks were discussed with the patient.                            All questions were answered, and informed consent                            was obtained. Prior Anticoagulants: The patient has                            taken no previous anticoagulant or antiplatelet                            agents. ASA Grade Assessment: II - A patient with                            mild systemic disease. After reviewing the risks                            and benefits, the patient was deemed in                            satisfactory condition to undergo the procedure.  After obtaining informed consent, the endoscope was                            passed under direct vision. Throughout the                            procedure, the patient's blood pressure, pulse, and                            oxygen saturations were monitored continuously. The                            Model GIF-[id number]) scope was introduced                            through the  mouth, and advanced to the second part                            of duodenum. The upper GI endoscopy was                            accomplished without difficulty. The patient                            tolerated the procedure well. Scope In: Scope Out: Findings:                 The esophagus was normal.                           The stomach was normal.                           The examined duodenum was normal.                           The cardia and gastric fundus were normal on                            retroflexion. Complications:            No immediate complications. Estimated Blood Loss:     Estimated blood loss: none. Impression:               - No specimens collected. Recommendation:           - Patient has a contact number available for                            emergencies. The signs and symptoms of potential                            delayed complications were discussed with the                            patient. Return to normal activities tomorrow.  Written discharge instructions were provided to the                            patient.                           - Resume previous diet.                           - Continue present medications.                           - No repeat upper endoscopy.                           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

## 2016-12-04 NOTE — Progress Notes (Signed)
No problems noted in the recovery room. maw 

## 2016-12-04 NOTE — Patient Instructions (Addendum)
The esophagus, stomach and upper intestine look normal.  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 This is based upon test results and the history.  I recommend you try buspirone 10 mg 2 times a day. This medication is an anti-anxiety medication but we know it helps people with your symptoms. It can sometimes make you drowsy - if so only take at bedtime. I start people on 1/2 tablet for first week. I sent a prescription in - please pick that up and call soon to make an appointment to be seen in about 2 months (December). It can take weeks to see effects - needs to build up in the system.  I appreciate the opportunity to care for you. Gatha Mayer, MD, FACG     YOU HAD AN ENDOSCOPIC PROCEDURE TODAY AT [facility]:   Refer to the procedure report that was given to you for any specific questions about what was found during the examination.  If the procedure report does not answer your questions, please call your gastroenterologist to clarify.  If you requested that your care partner not be given the details of your procedure findings, then the procedure report has been included in a sealed envelope for you to review at your convenience later.  YOU SHOULD EXPECT: Some feelings of bloating in the abdomen. Passage of more gas than usual.  Walking can help get rid of the air that was put into your GI tract during the procedure and reduce the bloating. If you had a lower endoscopy (such as a colonoscopy or flexible sigmoidoscopy) you may notice spotting of blood in your stool or on the toilet paper. If you underwent a bowel prep for your procedure, you may not have a normal bowel movement for a few days.  Please Note:  You might notice some irritation and congestion in your nose or some drainage.  This is from the oxygen used during your procedure.  There is no need for concern and it should clear up in a day or  so.  SYMPTOMS TO REPORT IMMEDIATELY:    Following upper endoscopy (EGD)  Vomiting of blood or coffee ground material  New chest pain or pain under the shoulder blades  Painful or persistently difficult swallowing  New shortness of breath  Fever of 100F or higher  Black, tarry-looking stools  For urgent or emergent issues, a gastroenterologist can be reached at any hour by calling [phone].   DIET:  We do recommend a small meal at first, but then you may proceed to your regular diet.  Drink plenty of fluids but you should avoid alcoholic beverages for 24 hours.  ACTIVITY:  You should plan to take it easy for the rest of today and you should NOT DRIVE or use heavy machinery until tomorrow (because of the sedation medicines used during the test).    FOLLOW UP: Our staff will call the number listed on your records the next business day following your procedure to check on you and address any questions or concerns that you may have regarding the information given to you following your procedure. If we do not reach you, we will leave a message.  However, if you are feeling well and you are not experiencing any problems, there is no need to return our call.  We will assume that you have returned to your regular daily activities without incident.  If any biopsies were taken you  will be contacted by phone or by letter within the next 1-3 weeks.  Please call us at [phone] if you have not heard about the biopsies in 3 weeks.    SIGNATURES/CONFIDENTIALITY: You and/or your care partner have signed paperwork which will be entered into your electronic medical record.  These signatures attest to the fact that that the information above on your After Visit Summary has been reviewed and is understood.  Full responsibility of the confidentiality of this discharge information lies with you and/or your care-partner.    Try Buspirone 10 mg for dyspepsia functional. Please call for an  appointment with Dr. Carlean Purl in the office for December to see how you are doing. You may resume your current medications today. Please call if any questions or concerns.

## 2016-12-05 ENCOUNTER — Telehealth: Payer: Self-pay | Admitting: *Deleted

## 2016-12-05 ENCOUNTER — Encounter: Payer: Self-pay | Admitting: Physician Assistant

## 2016-12-05 NOTE — Telephone Encounter (Signed)
  Follow up Call-  Call back number 12/04/2016  Post procedure Call Back phone  # [phone]  Permission to leave phone message Yes  Some recent data might be hidden     Patient questions:  Do you have a fever, pain , or abdominal swelling? No. Pain Score  0 *  Have you tolerated food without any problems? Yes.    Have you been able to return to your normal activities? Yes.    Do you have any questions about your discharge instructions: Diet   No. Medications  No. Follow up visit  No.  Do you have questions or concerns about your Care? No.  Actions: * If pain score is 4 or above: No action needed, pain <4.

## 2017-01-01 ENCOUNTER — Encounter: Payer: Medicare Other | Admitting: Internal Medicine

## 2017-01-01 ENCOUNTER — Other Ambulatory Visit: Payer: Self-pay

## 2017-01-01 MED ORDER — BUSPIRONE HCL 10 MG PO TABS: 10.0000 mg | ORAL_TABLET | Freq: Two times a day (BID) | ORAL | 0 refills | Status: DC

## 2017-01-01 NOTE — Telephone Encounter (Signed)
Pharmacy requesting 90 day supply of Buspirone 10mg  tablets.  We sent in recently for a month's worth and 2 refills so resent for the 90 day requested.

## 2017-01-29 ENCOUNTER — Encounter: Payer: Self-pay | Admitting: Internal Medicine

## 2017-01-29 ENCOUNTER — Ambulatory Visit: Payer: Medicare Other | Admitting: Internal Medicine

## 2017-01-29 VITALS — BP 118/70 | HR 80 | Ht 64.0 in | Wt 106.4 lb

## 2017-01-29 DIAGNOSIS — K30 Functional dyspepsia: Secondary | ICD-10-CM | POA: Diagnosis not present

## 2017-01-29 NOTE — Patient Instructions (Addendum)
  Taper off your Buspirone by taking one every other night for a week and then stop.   I appreciate the opportunity to care for you. Silvano Rusk, MD, [facility]

## 2017-01-29 NOTE — Progress Notes (Signed)
Pamela Mcknight 71 y.o. 1946-02-11 [id number]  Assessment & Plan:   Encounter Diagnosis  Name Primary?  . Functional dyspepsia Yes   She is better if not resolved.  She wants to discontinue the buspirone.  I think that is fine, if her symptoms return I think that we will tell her that it is something that would benefit her if she continues it.  I will see her back as needed  CC: Thressa Sheller, MD  Subjective:   Chief Complaint: Follow-up of nausea lack of appetite early satiety  HPI The patient is here, I had done an upper endoscopy in October which was normal, she was seen for early satiety anorexia and some weight loss.  Buspirone 10 mg twice daily was started she fell back to 10 mg at bedtime.  She does not have any other symptoms now and her weight has stabilized.  She would like to see if she can do okay without the medication.  Wt Readings from Last 3 Encounters:  01/29/17 106 lb 6.4 oz (48.3 kg)  12/04/16 107 lb (48.5 kg)  12/02/16 107 lb 9.6 oz (48.8 kg)    Allergies  Allergen Reactions  . Contrast Media [Iodinated Diagnostic Agents] Rash  . Percocet [Oxycodone-Acetaminophen] Rash  . Sulfonamide Derivatives     angioedema  . Sulfamethoxazole Swelling    Mouth swells  . Iodine-131 Rash  . Oxycodone-Acetaminophen Rash   Current Meds  Medication Sig  . Cholecalciferol (VITAMIN D3) 2000 UNITS TABS Take by mouth.    . dapsone 25 MG tablet Take 25 mg by mouth. 2 by mouth in the am  . estradiol (ESTRACE) 1 MG tablet Take 1 mg by mouth daily.    . fish oil-omega-3 fatty acids 1000 MG capsule Take 1 g by mouth daily.   . metoprolol tartrate (LOPRESSOR) 25 MG tablet Take 25 mg by mouth 2 (two) times daily.  . mycophenolate (MYFORTIC) 180 MG EC tablet Take 180 mg by mouth. 2 by mouth in the am, 2 by mouth in the pm  . Probiotic Product (ALIGN PO) Take by mouth daily.  . tacrolimus (PROGRAF) 1 MG capsule Take 1 mg by mouth. 2 by mouth in the am, 2 by mouth in the  pm  . [DISCONTINUED] busPIRone (BUSPAR) 10 MG tablet Take 1 tablet (10 mg total) 2 (two) times daily by mouth. Take 1/2 tab bid x first week   Past Medical History:  Diagnosis Date  . Colonic polyp    hyerplastic 2002; negative 2011, Dr Sharlett Iles  . CRF (chronic renal failure)   . Diverticulitis of colon 1991  . Hyperlipidemia   . Hypertension   . Mitral valve disorder    Mitral Regurgitation on 2 D ECHO  . Osteopenia   . Polycystic kidney disease    Dr Erling Cruz; S/P transplant   Past Surgical History:  Procedure Laterality Date  . ABDOMINAL HYSTERECTOMY  1987   for fibroids  . AV FISTULA PLACEMENT  11/02/2010   Left UA   . BILATERAL SALPINGOOPHORECTOMY  2012   [facility], Dr Philis Pique  . COLONOSCOPY  2011   negative  . cosmetic eye surgery  1999  . ESOPHAGOGASTRODUODENOSCOPY    . KIDNEY TRANSPLANT  05/12/2011    [facility]; Dr Roxy Horseman  . TONSILLECTOMY     Review of Systems As above  Objective:   Physical Exam BP 118/70   Pulse 80   Ht 5\' 4"  (1.626 m)   Wt 106  lb 6.4 oz (48.3 kg)   BMI 18.26 kg/m  No acute distress

## 2018-07-01 ENCOUNTER — Telehealth: Payer: Self-pay | Admitting: Internal Medicine

## 2018-07-01 ENCOUNTER — Encounter (HOSPITAL_COMMUNITY): Payer: Self-pay

## 2018-07-01 ENCOUNTER — Emergency Department (HOSPITAL_COMMUNITY): Payer: Medicare Other

## 2018-07-01 ENCOUNTER — Emergency Department (HOSPITAL_COMMUNITY)
Admission: EM | Admit: 2018-07-01 | Discharge: 2018-07-01 | Disposition: A | Discharge status: Home / Self Care | Payer: Medicare Other | Attending: Emergency Medicine | Admitting: Emergency Medicine

## 2018-07-01 ENCOUNTER — Other Ambulatory Visit: Payer: Self-pay

## 2018-07-01 DIAGNOSIS — Z1159 Encounter for screening for other viral diseases: Secondary | ICD-10-CM | POA: Insufficient documentation

## 2018-07-01 DIAGNOSIS — R112 Nausea with vomiting, unspecified: Secondary | ICD-10-CM | POA: Diagnosis not present

## 2018-07-01 DIAGNOSIS — Z87891 Personal history of nicotine dependence: Secondary | ICD-10-CM | POA: Diagnosis not present

## 2018-07-01 DIAGNOSIS — E119 Type 2 diabetes mellitus without complications: Secondary | ICD-10-CM | POA: Insufficient documentation

## 2018-07-01 DIAGNOSIS — R509 Fever, unspecified: Secondary | ICD-10-CM

## 2018-07-01 DIAGNOSIS — Z940 Kidney transplant status: Secondary | ICD-10-CM | POA: Diagnosis not present

## 2018-07-01 DIAGNOSIS — N3000 Acute cystitis without hematuria: Secondary | ICD-10-CM | POA: Diagnosis not present

## 2018-07-01 DIAGNOSIS — R197 Diarrhea, unspecified: Secondary | ICD-10-CM

## 2018-07-01 DIAGNOSIS — Z79899 Other long term (current) drug therapy: Secondary | ICD-10-CM

## 2018-07-01 DIAGNOSIS — Z7960 Long term (current) use of unspecified immunomodulators and immunosuppressants: Secondary | ICD-10-CM

## 2018-07-01 DIAGNOSIS — I10 Essential (primary) hypertension: Secondary | ICD-10-CM | POA: Insufficient documentation

## 2018-07-01 LAB — URINALYSIS, ROUTINE W REFLEX MICROSCOPIC
Bilirubin Urine: NEGATIVE
Glucose, UA: NEGATIVE mg/dL
Hgb urine dipstick: NEGATIVE
Ketones, ur: 20 mg/dL — AB
Nitrite: POSITIVE — AB
Protein, ur: 30 mg/dL — AB
Specific Gravity, Urine: 1.016 (ref 1.005–1.030)
pH: 5 (ref 5.0–8.0)

## 2018-07-01 LAB — COMPREHENSIVE METABOLIC PANEL
ALT: 13 U/L (ref 0–44)
AST: 17 U/L (ref 15–41)
Albumin: 3.5 g/dL (ref 3.5–5.0)
Alkaline Phosphatase: 41 U/L (ref 38–126)
Anion gap: 10 (ref 5–15)
BUN: 13 mg/dL (ref 8–23)
CO2: 21 mmol/L — ABNORMAL LOW (ref 22–32)
Calcium: 8.6 mg/dL — ABNORMAL LOW (ref 8.9–10.3)
Chloride: 99 mmol/L (ref 98–111)
Creatinine, Ser: 1.18 mg/dL — ABNORMAL HIGH (ref 0.44–1.00)
GFR calc Af Amer: 54 mL/min — ABNORMAL LOW (ref >60)
GFR calc non Af Amer: 46 mL/min — ABNORMAL LOW (ref >60)
Glucose, Bld: 157 mg/dL — ABNORMAL HIGH (ref 70–99)
Potassium: 3.5 mmol/L (ref 3.5–5.1)
Sodium: 130 mmol/L — ABNORMAL LOW (ref 135–145)
Total Bilirubin: 1.1 mg/dL (ref 0.3–1.2)
Total Protein: 6.5 g/dL (ref 6.5–8.1)

## 2018-07-01 LAB — CBC WITH DIFFERENTIAL/PLATELET
Abs Immature Granulocytes: 0.1 10*3/uL — ABNORMAL HIGH (ref 0.00–0.07)
Basophils Absolute: 0 10*3/uL (ref 0.0–0.1)
Basophils Relative: 0 %
Eosinophils Absolute: 0 10*3/uL (ref 0.0–0.5)
Eosinophils Relative: 0 %
HCT: 32.6 % — ABNORMAL LOW (ref 36.0–46.0)
Hemoglobin: 10.6 g/dL — ABNORMAL LOW (ref 12.0–15.0)
Immature Granulocytes: 1 %
Lymphocytes Relative: 1 %
Lymphs Abs: 0.3 10*3/uL — ABNORMAL LOW (ref 0.7–4.0)
MCH: 32 pg (ref 26.0–34.0)
MCHC: 32.5 g/dL (ref 30.0–36.0)
MCV: 98.5 fL (ref 80.0–100.0)
Monocytes Absolute: 1.6 10*3/uL — ABNORMAL HIGH (ref 0.1–1.0)
Monocytes Relative: 8 %
Neutro Abs: 17 10*3/uL — ABNORMAL HIGH (ref 1.7–7.7)
Neutrophils Relative %: 90 %
Platelets: 155 10*3/uL (ref 150–400)
RBC: 3.31 MIL/uL — ABNORMAL LOW (ref 3.87–5.11)
RDW: 11.9 % (ref 11.5–15.5)
WBC: 18.9 10*3/uL — ABNORMAL HIGH (ref 4.0–10.5)
nRBC: 0 % (ref 0.0–0.2)

## 2018-07-01 LAB — SARS CORONAVIRUS 2 BY RT PCR (HOSPITAL ORDER, PERFORMED IN ~~LOC~~ HOSPITAL LAB): SARS Coronavirus 2: NEGATIVE

## 2018-07-01 LAB — LACTIC ACID, PLASMA: Lactic Acid, Venous: 0.9 mmol/L (ref 0.5–1.9)

## 2018-07-01 LAB — LIPASE, BLOOD: Lipase: 29 U/L (ref 11–51)

## 2018-07-01 MED ORDER — CEPHALEXIN 500 MG PO CAPS: 500.0000 mg | ORAL_CAPSULE | Freq: Three times a day (TID) | ORAL | 0 refills | Status: DC

## 2018-07-01 MED ORDER — LACTATED RINGERS IV BOLUS
1000.0000 mL | Freq: Once | INTRAVENOUS | Status: AC
  Administered 2018-07-01: 1000 mL via INTRAVENOUS

## 2018-07-01 MED ORDER — ONDANSETRON HCL 4 MG/2ML IJ SOLN
4.0000 mg | Freq: Once | INTRAMUSCULAR | Status: AC
  Administered 2018-07-01: 4 mg via INTRAVENOUS
  Filled 2018-07-01: qty 2

## 2018-07-01 MED ORDER — SODIUM CHLORIDE 0.9 % IV BOLUS
1000.0000 mL | Freq: Once | INTRAVENOUS | Status: AC
  Administered 2018-07-01: 1000 mL via INTRAVENOUS

## 2018-07-01 MED ORDER — ACETAMINOPHEN 500 MG PO TABS
1000.0000 mg | ORAL_TABLET | Freq: Once | ORAL | Status: AC
  Administered 2018-07-01: 1000 mg via ORAL
  Filled 2018-07-01: qty 2

## 2018-07-01 MED ORDER — ONDANSETRON 4 MG PO TBDP: ORAL_TABLET | ORAL | 0 refills | Status: DC

## 2018-07-01 MED ORDER — SODIUM CHLORIDE 0.9 % IV SOLN
1.0000 g | Freq: Once | INTRAVENOUS | Status: AC
  Administered 2018-07-01: 1 g via INTRAVENOUS
  Filled 2018-07-01: qty 10

## 2018-07-01 NOTE — Telephone Encounter (Signed)
Covid-19 travel screening questions  Have you traveled in the last 14 days? NO If yes where?  Do you now or have you had a fever in the last 14 days? YES   Do you have any respiratory symptoms of shortness of breath or cough now or in the last 14 days? NO     Do you have any family members or close contacts with diagnosed or suspected Covid-19? NO

## 2018-07-01 NOTE — Telephone Encounter (Signed)
I spoke to the patient, she is bloated without any significant abdominal pain but has discomfort.  1 loose stool a day for about a week or so.  Feels thirsty.  Very weak.  COVID questionnaire was negative as above.  She has a roommate who is had an earache but no sick contacts.  I have asked her to come for and have ordered labs to include CBC CMET I am going to go ahead and do a CMV PCR because the patient is on immunosuppressive's for a kidney transplant.  I will also have her do a stool culture, lactoferrin and C. difficile PCR. She is to hydrate aggressively try a brat diet soups etc.  She was advised that if she becomes severely ill she could need to go to an emergency room or if less severely ill in urgent care with her primary care.

## 2018-07-01 NOTE — Telephone Encounter (Signed)
Please do a Covid-19 screening questions and let me know what the answers are and I will then respond

## 2018-07-01 NOTE — Discharge Instructions (Signed)
Stay hydrated.   You have a urinary tract infection. Take keflex three times daily for a week   Take tylenol for fever.   You will be called if your blood culture is positive   See your doctor in a week   Return to ER if you have worse vomiting, fever for a week, dehydration, severe abdominal pain

## 2018-07-01 NOTE — ED Provider Notes (Signed)
[facility] DEPT Provider Note   CSN: [id number] Arrival date & time: 07/01/18  1417    History   Chief Complaint Chief Complaint  Patient presents with   Emesis   Diarrhea    HPI Pamela Mcknight is a 73 y.o. female.     The history is provided by the patient.  Diarrhea  Quality:  Watery Severity:  Mild Onset quality:  Gradual Duration:  2 days Timing:  Constant Progression:  Unchanged Relieved by:  Nothing Worsened by:  Nothing Ineffective treatments:  None tried Associated symptoms: abdominal pain (cramping), fever and vomiting   Associated symptoms: no arthralgias, no chills, no recent cough and no URI   Risk factors: no sick contacts   Risk factors comment:  Hx of renal transplant on immunosuppressants.   Past Medical History:  Diagnosis Date   Colonic polyp    hyerplastic 2002; negative 2011, Dr Sharlett Iles   CRF (chronic renal failure)    Diverticulitis of colon 1991   Hyperlipidemia    Hypertension    Mitral valve disorder    Mitral Regurgitation on 2 D ECHO   Osteopenia    Polycystic kidney disease    Dr Erling Cruz; S/P transplant    Patient Active Problem List   Diagnosis Date Noted   Functional dyspepsia 01/29/2017   Menopausal symptom 12/02/2016   Encounter for aftercare following kidney transplant 06/05/2016   Encounter for monitoring tacrolimus therapy 06/05/2016   Squamous cell skin cancer 06/06/2013   Transplant recipient 06/24/2011   Diabetes mellitus ([place]) June 06, 2011   Hypomagnesemia 06/06/2011   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   Immunosuppressive management encounter following kidney transplant 05/17/2011   Kidney replaced by transplant 05/14/2011   Anemia of renal disease 01/10/2011   Diverticulosis of intestine 01/10/2011   Mitral valve prolapse 01/10/2011   RENAL INSUFFICIENCY, CHRONIC 08/17/2009   HYPERLIPIDEMIA 07/04/2008   Hypertension goal BP  (blood pressure) < 130/80 07/04/2008   COLONIC POLYPS, HX OF 07/04/2008   DIVERTICULOSIS, COLON 07/22/2007   POLYCYSTIC KIDNEY DISEASE 07/22/2007   DIVERTICULITIS, HX OF 07/22/2007    Past Surgical History:  Procedure Laterality Date   ABDOMINAL HYSTERECTOMY  1987   for fibroids   AV FISTULA PLACEMENT  11/02/2010   Left UA    BILATERAL SALPINGOOPHORECTOMY  2012   [facility], Dr Philis Pique   COLONOSCOPY  2011   negative   cosmetic eye surgery  1999   ESOPHAGOGASTRODUODENOSCOPY     KIDNEY TRANSPLANT  05/12/2011    [facility]; Dr Roxy Horseman   TONSILLECTOMY       OB History   No obstetric history on file.      Home Medications    Prior to Admission medications   Medication Sig Start Date End Date Taking? Authorizing Provider  Cholecalciferol (VITAMIN D3) 2000 UNITS TABS Take by mouth.      [provider]  dapsone 25 MG tablet Take 25 mg by mouth. 2 by mouth in the am    [provider]  estradiol (ESTRACE) 1 MG tablet Take 1 mg by mouth daily.      [provider]  fish oil-omega-3 fatty acids 1000 MG capsule Take 1 g by mouth daily.     [provider]  metoprolol tartrate (LOPRESSOR) 25 MG tablet Take 25 mg by mouth 2 (two) times daily.    [provider]  mycophenolate (MYFORTIC) 180 MG EC tablet Take 180 mg by mouth. 2 by mouth in  the am, 2 by mouth in the pm    [provider]  Probiotic Product (ALIGN PO) Take by mouth daily.    [provider]  tacrolimus (PROGRAF) 1 MG capsule Take 1 mg by mouth. 2 by mouth in the am, 2 by mouth in the pm    [provider]    Family History Family History  Problem Relation Age of Onset   Stroke Father 68   Polycystic kidney disease Father    Glaucoma Mother    Hyperlipidemia Mother    Macular degeneration Mother    Osteoarthritis Mother    Polycystic kidney disease Sister    Hypertension Brother    Polycystic kidney  disease Paternal Uncle    Polycystic kidney disease Paternal Grandmother    Heart attack Maternal Aunt        X2, both > 64   Heart attack Maternal Uncle        X2, both < 70   Diabetes Neg Hx    Cancer Neg Hx    Colon cancer Neg Hx    Stomach cancer Neg Hx     Social History Social History   Tobacco Use   Smoking status: Former Smoker    Packs/day: 1.00    Years: 10.00    Pack years: 10.00    Last attempt to quit: 02/25/1978    Years since quitting: 40.3   Smokeless tobacco: Never Used   Tobacco comment: 30 years ago as of 2012  Substance Use Topics   Alcohol use: Yes    Comment: rarely   Drug use: No     Allergies   Contrast media [iodinated diagnostic agents]; Percocet [oxycodone-acetaminophen]; Sulfonamide derivatives; Sulfamethoxazole; Iodine-131; and Oxycodone-acetaminophen   Review of Systems Review of Systems  Constitutional: Positive for fever. Negative for chills.  HENT: Negative for ear pain and sore throat.   Eyes: Negative for pain and visual disturbance.  Respiratory: Negative for cough and shortness of breath.   Cardiovascular: Negative for chest pain and palpitations.  Gastrointestinal: Positive for abdominal pain (cramping), diarrhea and vomiting.  Genitourinary: Negative for dysuria and hematuria.  Musculoskeletal: Negative for arthralgias and back pain.  Skin: Negative for color change and rash.  Neurological: Negative for seizures and syncope.  All other systems reviewed and are negative.    Physical Exam Updated Vital Signs  ED Triage Vitals  Enc Vitals Group     BP 07/01/18 1427 138/65     Pulse Rate 07/01/18 1427 (!) 54     Resp 07/01/18 1427 14     Temp 07/01/18 1427 99.9 F (37.7 C)     Temp Source 07/01/18 1427 Oral     SpO2 07/01/18 1427 94 %     Weight 07/01/18 1426 118 lb (53.5 kg)     Height 07/01/18 1426 5\' 4"  (1.626 m)     Head Circumference --      Peak Flow --      Pain Score 07/01/18 1426 0     Pain Loc  --      Pain Edu? --      Excl. in [place]? --     Physical Exam Vitals signs and nursing note reviewed.  Constitutional:      General: She is not in acute distress.    Appearance: She is well-developed. She is not ill-appearing.  HENT:     Head: Normocephalic and atraumatic.     Nose: Nose normal.     Mouth/Throat:  Mouth: Mucous membranes are dry.  Eyes:     Extraocular Movements: Extraocular movements intact.     Conjunctiva/sclera: Conjunctivae normal.     Pupils: Pupils are equal, round, and reactive to light.  Neck:     Musculoskeletal: Normal range of motion and neck supple.  Cardiovascular:     Rate and Rhythm: Normal rate and regular rhythm.     Pulses: Normal pulses.     Heart sounds: Normal heart sounds. No murmur.  Pulmonary:     Effort: Pulmonary effort is normal. No respiratory distress.     Breath sounds: Normal breath sounds.  Abdominal:     General: Abdomen is flat. There is no distension.     Palpations: Abdomen is soft.     Tenderness: There is no abdominal tenderness.  Musculoskeletal: Normal range of motion.  Skin:    General: Skin is warm and dry.     Capillary Refill: Capillary refill takes less than 2 seconds.  Neurological:     General: No focal deficit present.     Mental Status: She is alert.  Psychiatric:        Mood and Affect: Mood normal.      ED Treatments / Results  Labs (all labs ordered are listed, but only abnormal results are displayed) Labs Reviewed  COMPREHENSIVE METABOLIC PANEL - Abnormal; Notable for the following components:      Result Value   Sodium 130 (*)    CO2 21 (*)    Glucose, Bld 157 (*)    Creatinine, Ser 1.18 (*)    Calcium 8.6 (*)    GFR calc non Af Amer 46 (*)    GFR calc Af Amer 54 (*)    All other components within normal limits  CBC WITH DIFFERENTIAL/PLATELET - Abnormal; Notable for the following components:   WBC 18.9 (*)    RBC 3.31 (*)    Hemoglobin 10.6 (*)    HCT 32.6 (*)    Neutro Abs 17.0  (*)    Lymphs Abs 0.3 (*)    Monocytes Absolute 1.6 (*)    Abs Immature Granulocytes 0.10 (*)    All other components within normal limits  SARS CORONAVIRUS 2 (HOSPITAL ORDER, [facility])  URINE CULTURE  CULTURE, BLOOD (ROUTINE X 2)  CULTURE, BLOOD (ROUTINE X 2)  LIPASE, BLOOD  LACTIC ACID, PLASMA  URINALYSIS, ROUTINE W REFLEX MICROSCOPIC    EKG None  Radiology Ct Abdomen Pelvis Wo Contrast  Result Date: 07/01/2018 CLINICAL DATA:  Abdominal pain EXAM: CT ABDOMEN AND PELVIS WITHOUT CONTRAST TECHNIQUE: Multidetector CT imaging of the abdomen and pelvis was performed following the standard protocol without IV contrast. COMPARISON:  CT dated 11/19/2016 FINDINGS: Lower chest: No acute abnormality. Hepatobiliary: Multiple cysts are again noted throughout the liver. Many of these are too small to characterize. The gallbladder is not well visualized on this exam. There appears to be some mild dilatation of the common bile duct which is slightly improved from prior study. Pancreas: Unremarkable. No pancreatic ductal dilatation or surrounding inflammatory changes. Spleen: Normal in size without focal abnormality. Adrenals/Urinary Tract: Again identified are findings of polycystic kidney disease. There are multiple hyperdense areas throughout both kidneys favored to represent residual more mole renal parenchyma or hemorrhagic/hyperdense cysts. These appear grossly stable from prior study. There is a left pelvic transplant kidney with no evidence of frank hydronephrosis or an obstructing stone. The urinary bladder is unremarkable. No adrenal mass identified. Stomach/Bowel: There is sigmoid  diverticulosis without CT evidence of diverticulitis. The appendix is unremarkable. There is no evidence of a small-bowel obstruction. Vascular/Lymphatic: Aortic atherosclerosis. No enlarged abdominal or pelvic lymph nodes. There are few mildly prominent pelvic lymph nodes, stable from prior  study. Reproductive: Status post hysterectomy. No adnexal masses. Other: No abdominal wall hernia or abnormality. No abdominopelvic ascites. Musculoskeletal: No acute or significant osseous findings. IMPRESSION: 1. No acute intra-abdominal abnormality detected. No findings to explain the patient's bloating and nausea/fatigue. 2. Again noted are findings of polycystic kidney disease. There is no hydronephrosis. 3. Left pelvic transplant kidney in place with no evidence of hydronephrosis or an obstructing stone. Electronically Signed   By: Constance Holster M.D.   On: 07/01/2018 16:09   Dg Chest Portable 1 View  Result Date: 07/01/2018 CLINICAL DATA:  Fevers EXAM: PORTABLE CHEST 1 VIEW COMPARISON:  10/31/2010 FINDINGS: The heart size and mediastinal contours are within normal limits. Both lungs are clear. The visualized skeletal structures are unremarkable. IMPRESSION: No active disease. Electronically Signed   By: Inez Catalina M.D.   On: 07/01/2018 14:54    Procedures Procedures (including critical care time)  Medications Ordered in ED Medications  ondansetron (ZOFRAN) injection 4 mg (4 mg Intravenous Given 07/01/18 1534)  lactated ringers bolus 1,000 mL (0 mLs Intravenous Stopped 07/01/18 1655)  acetaminophen (TYLENOL) tablet 1,000 mg (1,000 mg Oral Given 07/01/18 1545)  sodium chloride 0.9 % bolus 1,000 mL (1,000 mLs Intravenous New Bag/Given (Non-Interop) 07/01/18 1656)     Initial Impression / Assessment and Plan / ED Course  I have reviewed the triage vital signs and the nursing notes.  Pertinent labs & imaging results that were available during my care of the patient were reviewed by me and considered in my medical decision making (see chart for details).     Pamela Mcknight is a 73 year old female with history of hypertension, high cholesterol, polycystic kidney disease status post kidney transplant on immunosuppressants who presents to the ED with nausea, vomiting, diarrhea, fever.  Patient  has had symptoms for the last 2 days.  She is febrile but otherwise with normal vitals.  Patient overall well-appearing.  Does not have really specific abdominal tenderness on exam.  She states that she is had some cramping in her abdomen.  Denies any known sick contacts or suspicious food intake.  Had history of diverticulitis about 30 years ago.  Denies any blood in her vomit or stool.  Has had difficulty staying hydrated at home.  Will evaluate with lab work including blood cultures, lactic acid, CT abdomen pelvis, chest x-ray, urinalysis.  Possible sepsis.  Will give Tylenol, lactated ringer bolus, IV Zofran.  Will hold antibiotics at this time as no obvious source.  Coronavirus testing also will be sent. No recent abx use.  Patient with leukocytosis of 19 but normal lactic acid.  No significant anemia, electrolyte abnormality.  Kidney function at baseline.  Lipase normal.  Gallbladder and liver enzymes within normal limits.  Chest x-ray with no signs of pneumonia, pneumothorax, pleural effusion.  CT of the abdomen and pelvis showed no acute findings.  COVID testing is also negative.  Patient feeling better after IV fluids and IV Zofran.  Patient was handed off to oncoming ED staff with patient pending urine studies and reevaluation.  Overall there is no source for infection.  Clinically patient appears well.  She is on immunosuppressants but otherwise she does not have high risk for occult bacteremia.  Had discussion with the patient that if  urine studies are negative she will be likely okay for discharge to home.  She likely has a viral gastroenteritis versus foodborne pathogen causing her symptoms today of nausea, vomiting, diarrhea.  Blood cultures have been collected just in case as well. She feels okay with this plan.  Please see Dr. Kathleen Lime note for further results, evaluation, disposition of patient.   This chart was dictated using voice recognition software.  Despite best efforts to proofread,  errors  can occur which can change the documentation meaning.    Final Clinical Impressions(s) / ED Diagnoses   Final diagnoses:  Nausea vomiting and diarrhea    ED Discharge Orders    None       Lennice Sites, DO 07/01/18 1702

## 2018-07-01 NOTE — ED Triage Notes (Signed)
Pt states that she has had emesis, diarrhea, fever, bloating, chills since yesterday. Pt called PCP, but she felt unable to make it. Pt state that she is a kidney recipient (2013). 101.9 fever at home. No meds taken today.

## 2018-07-01 NOTE — ED Provider Notes (Signed)
  Physical Exam  BP (!) 104/58   Pulse 80   Temp (!) 100.7 F (38.2 C) (Rectal)   Resp 12   Ht 5\' 4"  (1.626 m)   Wt 53.5 kg   SpO2 94%   BMI 20.25 kg/m   Physical Exam  ED Course/Procedures     Procedures  MDM  Care assumed at 5 pm from Dr. Ronnald Nian.  Patient has kidney transplant on immunosuppressants here with abdominal pain, vomiting, fever. Febrile 102 in the ED. Patient's CT was unremarkable. WBC 18. COVID and UA pending at sign out.  6:24 PM COVID negative. UA + UTI. Urine and blood cultures sent. Tolerated PO in the ED. Given rocephin for UTI. Discussed in detail regarding admission for observation vs close outpatient follow up and she wants to go home. She knows that she will be called if her blood culture is positive then she will need to be admitted.        Pamela Freeze, MD 07/01/18 [phone]

## 2018-07-01 NOTE — ED Notes (Signed)
Bed: HW38 Expected date:  Expected time:  Means of arrival:  Comments: 73yo headache/brain tumor

## 2018-07-01 NOTE — Telephone Encounter (Signed)
Temp of 100.9 F oral- bloating and nausea. Fatigue. No appetite.  No abd pain. Watery diarrhea 1 x daily.  Has not been around any sick contacts.  No recent abx use.  Distant history of diverticulitis.  Symptoms started a few weeks ago but has worsened over the past few days.  Please advise

## 2018-07-01 NOTE — ED Notes (Signed)
Patient is aware a urine sample is needed and she is going to let us know when she can urinate.

## 2018-07-02 ENCOUNTER — Telehealth: Payer: Self-pay | Admitting: Internal Medicine

## 2018-07-02 NOTE — Telephone Encounter (Signed)
Done

## 2018-07-02 NOTE — Telephone Encounter (Signed)
She went to ED and was dx w/ UTI  Better now  Need to cancel labs in Epic that I placed

## 2018-07-03 LAB — URINE CULTURE: Culture: >=100000 — AB

## 2018-07-04 ENCOUNTER — Telehealth: Payer: Self-pay | Admitting: Emergency Medicine

## 2018-07-04 NOTE — Telephone Encounter (Signed)
Post ED Visit - Positive Culture Follow-up  Culture report reviewed by antimicrobial stewardship pharmacist: [facility] Team []  Elenor Quinones, Pharm.D. []  Heide Guile, Pharm.D., BCPS AQ-ID []  Parks Neptune, Pharm.D., BCPS []  Alycia Rossetti, Pharm.D., BCPS []  [place], Pharm.D., BCPS, AAHIVP []  Legrand Como, Pharm.D., BCPS, AAHIVP []  Salome Arnt, PharmD, BCPS []  Johnnette Gourd, PharmD, BCPS []  Hughes Better, PharmD, BCPS []  Leeroy Cha, PharmD []  Laqueta Linden, PharmD, BCPS []  Albertina Parr, PharmD  [place] Team []  Leodis Sias, PharmD []  Lindell Spar, PharmD []  Royetta Asal, PharmD []  Graylin Shiver, Rph []  Rema Fendt) Glennon Mac, PharmD []  Arlyn Dunning, PharmD []  Netta Cedars, PharmD []  Dia Sitter, PharmD []  Leone Haven, PharmD []  Gretta Arab, PharmD [x]  Theodis Shove, PharmD []  Peggyann Juba, PharmD []  Reuel Boom, PharmD   Positive urine culture Treated with Cephalexin, organism sensitive to the same and no further patient follow-up is required at this time.  Larene Beach Gowri Suchan 07/04/2018, 8:18 AM

## 2018-07-06 LAB — CULTURE, BLOOD (ROUTINE X 2): Culture: NO GROWTH

## 2018-08-04 ENCOUNTER — Other Ambulatory Visit: Payer: Self-pay

## 2018-08-04 ENCOUNTER — Ambulatory Visit (INDEPENDENT_AMBULATORY_CARE_PROVIDER_SITE_OTHER): Payer: Medicare Other | Admitting: Internal Medicine

## 2018-08-04 ENCOUNTER — Encounter: Payer: Self-pay | Admitting: Internal Medicine

## 2018-08-04 DIAGNOSIS — K30 Functional dyspepsia: Secondary | ICD-10-CM

## 2018-08-04 MED ORDER — BUSPIRONE HCL 10 MG PO TABS: 10.0000 mg | ORAL_TABLET | Freq: Two times a day (BID) | ORAL | 3 refills | Status: DC

## 2018-08-04 NOTE — Progress Notes (Signed)
TELEHEALTH ENCOUNTER IN SETTING OF COVID-19 PANDEMIC - REQUESTED BY PATIENT SERVICE PROVIDED BY TELEMEDECINE - TYPE: Phone PATIENT LOCATION: Home PATIENT HAS CONSENTED TO TELEHEALTH VISIT PROVIDER LOCATION: OFFICE REFERRING PROVIDER:n?A PCP is Dr. Kathyrn Lass PARTICIPANTS OTHER THAN PATIENT:none TIME SPENT ON CALL: 79 mins    Pamela Mcknight 73 y.o. January 29, 1946 [id number]  Assessment & Plan:  Functional dyspepsia This has recurred.  She had responded to buspirone in 2018 and then decided to go without therapy.  We will try the buspirone again.  Either twice daily or nightly.  Potential side effects of sleepiness nightmares and dystonic reaction reviewed.  As long as that is working she will continue.  If it does not help within a month she will let me know.  So follow-up as needed.  Reassuring information with EGD 2018, she is up-to-date with colon cancer screening she is not constipated and is not experiencing change in bowels, and CT scan on 07/01/2018 was without significant problem.   I appreciate the opportunity to care for this patient. CC: Kathyrn Lass, MD    Subjective:   Chief Complaint: Bloating  HPI Kalei is complaining of a feeling of bloating constantly but much worse with eating.  I feel really bad after I eat she says.  This is been going on for months.  She did have some trouble last fall when her mom passed away at the age of 34 with dementia.  Then more problems earlier this year, she was going to call but then the COVID-19 pandemic struck if she waited.  She was diagnosed with a significant urinary tract infection, E. coli UTI, on 07/01/2018 in the emergency department.  At that time she did have a mild anemia with leukocytosis and mild hyponatremia.  Mild anemia not a new problem based upon lab review.  She is chronically immunosuppressed status post kidney transplant for polycystic kidney disease.  CT scan did not show any changes in the size of her cysts.  She is  moving her bowels regularly.  Does not go more than 2 days without defecation.  She has not lost weight like she did in 2018.  At that point she had a negative EGD in the fall, I had seen her back in December 2018 and she was doing well on 10 mg of buspirone at bedtime for functional dyspepsia.  She wanted to try to go without therapy.  And so she stopped it and apparently did well until lately.  She says these problems are very similar to that.  2011 colonoscopy with diverticulosis.  She is due for repeat 2021, August.  Wt Readings from Last 3 Encounters:  08/04/18 114 lb (51.7 kg)  07/01/18 118 lb (53.5 kg)  01/29/17 106 lb 6.4 oz (48.3 kg)    Allergies  Allergen Reactions  . Contrast Media [Iodinated Diagnostic Agents] Rash  . Percocet [Oxycodone-Acetaminophen] Rash  . Sulfonamide Derivatives     angioedema  . Sulfamethoxazole Swelling    Mouth swells  . Iodine-131 Rash  . Oxycodone-Acetaminophen Rash   Current Meds  Medication Sig  . Cholecalciferol (VITAMIN D3) 2000 UNITS TABS Take by mouth.    . dapsone 25 MG tablet Take 25 mg by mouth. 2 by mouth in the am  . estradiol (ESTRACE) 1 MG tablet Take 1 mg by mouth daily.    . fish oil-omega-3 fatty acids 1000 MG capsule Take 1 g by mouth daily.   . metoprolol tartrate (LOPRESSOR) 25 MG tablet Take 25  mg by mouth 2 (two) times daily.  . mycophenolate (MYFORTIC) 180 MG EC tablet Take 180 mg by mouth. 1by mouth in the am, 2 by mouth in the pm  . Probiotic Product (ALIGN PO) Take by mouth daily.  Marland Kitchen PROGRAF 0.5 MG capsule 2 in the AM and 3 in the PM   Past Medical History:  Diagnosis Date  . Colonic polyp    hyerplastic 2002; negative 2011, Dr Sharlett Iles  . CRF (chronic renal failure)   . Diverticulitis of colon 1991  . Hyperlipidemia   . Hypertension   . Mitral valve disorder    Mitral Regurgitation on 2 D ECHO  . Osteopenia   . Polycystic kidney disease    Dr Erling Cruz; S/P transplant   Past Surgical History:  Procedure  Laterality Date  . ABDOMINAL HYSTERECTOMY  1987   for fibroids  . AV FISTULA PLACEMENT  11/02/2010   Left UA   . BILATERAL SALPINGOOPHORECTOMY  2012   [facility], Dr Philis Pique  . COLONOSCOPY  2011   negative  . cosmetic eye surgery  1999  . ESOPHAGOGASTRODUODENOSCOPY    . KIDNEY TRANSPLANT  05/12/2011    [facility]; Dr Roxy Horseman  . TONSILLECTOMY     Social History   Social History Narrative   The patient is single, she drinks some alcohol, she is a former smoker    family history includes Glaucoma in her mother; Heart attack in her maternal aunt and maternal uncle; Hyperlipidemia in her mother; Hypertension in her brother; Macular degeneration in her mother; Osteoarthritis in her mother; Other (age of onset: 46) in her mother; Polycystic kidney disease in her father, paternal grandmother, paternal uncle, and sister; Stroke (age of onset: 82) in her father.   Review of Systems As per HPI  17 minutes time spent with patient > half in counseling coordination of care

## 2018-08-04 NOTE — Patient Instructions (Signed)
I am sorry the bloating problem returned.  Let us hope that the buspirone works just as well as it did previously.  Overall it is a very benign and safe drug.  I have prescribed 10 mg twice a day, you can start at that dose since you have taken it before but if you want to ease into it you could take half a tablet twice a day to start.  In the past it looks like you found the just taking at bedtime work.  You can work with this and see what works best with the minimal amount of dosing.  If you do not feel significantly improved in a month contact me.  I appreciate the opportunity to care for you. Gatha Mayer, MD, [facility]

## 2018-08-04 NOTE — Assessment & Plan Note (Signed)
This has recurred.  She had responded to buspirone in 2018 and then decided to go without therapy.  We will try the buspirone again.  Either twice daily or nightly.  Potential side effects of sleepiness nightmares and dystonic reaction reviewed.  As long as that is working she will continue.  If it does not help within a month she will let me know.  So follow-up as needed.  Reassuring information with EGD 2018, she is up-to-date with colon cancer screening she is not constipated and is not experiencing change in bowels, and CT scan on 07/01/2018 was without significant problem.

## 2019-01-28 ENCOUNTER — Encounter (HOSPITAL_COMMUNITY): Payer: Self-pay | Admitting: Emergency Medicine

## 2019-01-28 ENCOUNTER — Emergency Department (HOSPITAL_COMMUNITY)
Admission: EM | Admit: 2019-01-28 | Discharge: 2019-01-28 | Disposition: A | Discharge status: Home / Self Care | Payer: Medicare Other | Attending: Emergency Medicine | Admitting: Emergency Medicine

## 2019-01-28 ENCOUNTER — Other Ambulatory Visit: Payer: Self-pay

## 2019-01-28 ENCOUNTER — Emergency Department (HOSPITAL_COMMUNITY): Payer: Medicare Other

## 2019-01-28 DIAGNOSIS — I10 Essential (primary) hypertension: Secondary | ICD-10-CM | POA: Diagnosis not present

## 2019-01-28 DIAGNOSIS — Z940 Kidney transplant status: Secondary | ICD-10-CM | POA: Diagnosis not present

## 2019-01-28 DIAGNOSIS — H532 Diplopia: Secondary | ICD-10-CM | POA: Insufficient documentation

## 2019-01-28 DIAGNOSIS — Z87891 Personal history of nicotine dependence: Secondary | ICD-10-CM | POA: Insufficient documentation

## 2019-01-28 LAB — COMPREHENSIVE METABOLIC PANEL
ALT: 11 U/L (ref 0–44)
AST: 17 U/L (ref 15–41)
Albumin: 3.8 g/dL (ref 3.5–5.0)
Alkaline Phosphatase: 37 U/L — ABNORMAL LOW (ref 38–126)
Anion gap: 12 (ref 5–15)
BUN: 24 mg/dL — ABNORMAL HIGH (ref 8–23)
CO2: 24 mmol/L (ref 22–32)
Calcium: 9.7 mg/dL (ref 8.9–10.3)
Chloride: 99 mmol/L (ref 98–111)
Creatinine, Ser: 1.21 mg/dL — ABNORMAL HIGH (ref 0.44–1.00)
GFR calc Af Amer: 52 mL/min — ABNORMAL LOW (ref >60)
GFR calc non Af Amer: 45 mL/min — ABNORMAL LOW (ref >60)
Glucose, Bld: 100 mg/dL — ABNORMAL HIGH (ref 70–99)
Potassium: 3.9 mmol/L (ref 3.5–5.1)
Sodium: 135 mmol/L (ref 135–145)
Total Bilirubin: 0.3 mg/dL (ref 0.3–1.2)
Total Protein: 7.1 g/dL (ref 6.5–8.1)

## 2019-01-28 LAB — CBC
HCT: 38.2 % (ref 36.0–46.0)
Hemoglobin: 12.2 g/dL (ref 12.0–15.0)
MCH: 31.1 pg (ref 26.0–34.0)
MCHC: 31.9 g/dL (ref 30.0–36.0)
MCV: 97.4 fL (ref 80.0–100.0)
Platelets: 231 10*3/uL (ref 150–400)
RBC: 3.92 MIL/uL (ref 3.87–5.11)
RDW: 12.2 % (ref 11.5–15.5)
WBC: 5.9 10*3/uL (ref 4.0–10.5)
nRBC: 0 % (ref 0.0–0.2)

## 2019-01-28 MED ORDER — GADOBUTROL 1 MMOL/ML IV SOLN: 5.0000 mL | Freq: Once | INTRAVENOUS | Status: DC | PRN

## 2019-01-28 NOTE — ED Triage Notes (Signed)
Pt states that she has had a sore throat and sinusitis x 10 days. Went to pcp 6 days ago and received amoxicillin. Developed double vision yesterday. Pcp said to come in for additional testing. Alert and oriented.

## 2019-01-28 NOTE — Discharge Instructions (Addendum)
You were evaluated in the Emergency Department and after careful evaluation, we did not find any emergent condition requiring admission or further testing in the hospital.  Your exam/testing today was overall reassuring.  Your MRI did not show any abnormalities of your brain that would explain your double vision.  Please follow-up with your primary care doctor and an eye doctor for further evaluation.  As discussed, please avoid driving until your vision is addressed.  Please return to the Emergency Department if you experience any worsening of your condition.  We encourage you to follow up with a primary care provider.  Thank you for allowing [place] to be a part of your care.

## 2019-01-28 NOTE — ED Provider Notes (Signed)
[facility] Emergency Department Provider Note MRN:  [id number]  Arrival date & time: 01/28/19     Chief Complaint   Sore Throat and Diplopia   History of Present Illness   Pamela Mcknight is a 73 y.o. year-old female with a history of kidney transplant presenting to the ED with chief complaint of diplopia.  Patient endorsing 2 or 3 weeks of sudden onset headaches, occurring randomly and intermittently throughout the day.  For the past week patient has been experiencing cold-like symptoms with sinus congestion.  Was diagnosed with sinusitis and prescribed Augmentin, which she has been taking for the past 4 to 5 days.  Yesterday she began experiencing double vision.  Occurs with both eyes open.  Has been fairly constant.  Woke up this morning and it was much worse, seem to have resolved when looking in the mirror.  On the way here she saw numerous and excessive cars on the road.  She denies fever, no chest pain or shortness of breath, no abdominal pain, vomiting, no diarrhea, no numbness or weakness to the arms or legs.  Review of Systems  A complete 10 system review of systems was obtained and all systems are negative except as noted in the HPI and PMH.   Patient's Health History    Past Medical History:  Diagnosis Date  . Colonic polyp    hyerplastic 2002; negative 2011, Dr Sharlett Iles  . CRF (chronic renal failure)   . Diverticulitis of colon 1991  . Hyperlipidemia   . Hypertension   . Mitral valve disorder    Mitral Regurgitation on 2 D ECHO  . Osteopenia   . Polycystic kidney disease    Dr Erling Cruz; S/P transplant    Past Surgical History:  Procedure Laterality Date  . ABDOMINAL HYSTERECTOMY  1987   for fibroids  . AV FISTULA PLACEMENT  11/02/2010   Left UA   . BILATERAL SALPINGOOPHORECTOMY  2012   [facility], Dr Philis Pique  . COLONOSCOPY  2011   negative  . cosmetic eye surgery  1999  . ESOPHAGOGASTRODUODENOSCOPY    . KIDNEY TRANSPLANT   05/12/2011    [facility]; Dr Roxy Horseman  . TONSILLECTOMY      Family History  Problem Relation Age of Onset  . Stroke Father 44  . Polycystic kidney disease Father   . Glaucoma Mother   . Hyperlipidemia Mother   . Macular degeneration Mother   . Osteoarthritis Mother   . Other Mother 78       died with respirtory failure  . Polycystic kidney disease Sister   . Hypertension Brother   . Polycystic kidney disease Paternal Uncle   . Polycystic kidney disease Paternal Grandmother   . Heart attack Maternal Aunt        X2, both > 65  . Heart attack Maternal Uncle        X2, both < 55  . Diabetes Neg Hx   . Colon cancer Neg Hx   . Stomach cancer Neg Hx     Social History   Socioeconomic History  . Marital status: Single    Spouse name: Not on file  . Number of children: 0  . Years of education: Not on file  . Highest education level: Not on file  Occupational History  . Not on file  Social Needs  . Financial resource strain: Not on file  . Food insecurity    Worry: Not on file    Inability:  Not on file  . Transportation needs    Medical: Not on file    Non-medical: Not on file  Tobacco Use  . Smoking status: Former Smoker    Packs/day: 1.00    Years: 10.00    Pack years: 10.00    Quit date: 02/25/1978    Years since quitting: 40.9  . Smokeless tobacco: Never Used  . Tobacco comment: 30 years ago as of 2012  Substance and Sexual Activity  . Alcohol use: Yes    Comment: rarely  . Drug use: No  . Sexual activity: Not on file  Lifestyle  . Physical activity    Days per week: Not on file    Minutes per session: Not on file  . Stress: Not on file  Relationships  . Social Herbalist on phone: Not on file    Gets together: Not on file    Attends religious service: Not on file    Active member of club or organization: Not on file    Attends meetings of clubs or organizations: Not on file    Relationship status: Not on file  . Intimate partner  violence    Fear of current or ex partner: Not on file    Emotionally abused: Not on file    Physically abused: Not on file    Forced sexual activity: Not on file  Other Topics Concern  . Not on file  Social History Narrative   The patient is single, she drinks some alcohol, she is a former smoker      Physical Exam  Vital Signs and Nursing Notes reviewed Vitals:   01/28/19 1730 01/28/19 1900  BP: (!) 158/92 (!) 156/88  Pulse: 83 89  Resp: 18 20  Temp:    SpO2: 95% 95%    CONSTITUTIONAL: Well-appearing, NAD NEURO:  Alert and oriented x 3, normal and symmetric strength and sensation, normal coordination, normal speech, no aphasia, no neglect, no visual field cuts EYES:  eyes equal and reactive, normal extraocular movements ENT/NECK:  no LAD, no JVD CARDIO: Regular rate, well-perfused, normal S1 and S2 PULM:  CTAB no wheezing or rhonchi GI/GU:  normal bowel sounds, non-distended, non-tender MSK/SPINE:  No gross deformities, no edema SKIN:  no rash, atraumatic PSYCH:  Appropriate speech and behavior  Diagnostic and Interventional Summary    EKG Interpretation  Date/Time:    Ventricular Rate:    PR Interval:    QRS Duration:   QT Interval:    QTC Calculation:   R Axis:     Text Interpretation:        Labs Reviewed  COMPREHENSIVE METABOLIC PANEL - Abnormal; Notable for the following components:      Result Value   Glucose, Bld 100 (*)    BUN 24 (*)    Creatinine, Ser 1.21 (*)    Alkaline Phosphatase 37 (*)    GFR calc non Af Amer 45 (*)    GFR calc Af Amer 52 (*)    All other components within normal limits  CBC    MR BRAIN WO CONTRAST  Final Result      Medications  gadobutrol (GADAVIST) 1 MMOL/ML injection 5 mL (5 mLs Intravenous Canceled Entry 01/28/19 2103)     Procedures  /  Critical Care Procedures  ED Course and Medical Decision Making  I have reviewed the triage vital signs and the nursing notes.  Pertinent labs & imaging results that were  available during my care of  the patient were reviewed by me and considered in my medical decision making (see below for details).     Concern for CNS etiology of headaches and diplopia in this 73 year old female with history of kidney transplant on immunosuppressants.  Will need MRI to evaluate.  Reassuring neurological exam at this time.  MRI is unremarkable, patient continues to feel well despite mild and intermittent double vision.  She is requesting discharge.  Appropriate to follow-up with ophthalmology and primary care doctor.  Barth Kirks. Sedonia Small, [place] [email or web address]  Final Clinical Impressions(s) / ED Diagnoses     ICD-10-CM   1. Diplopia  H53.2     ED Discharge Orders    None       Discharge Instructions Discussed with and Provided to Patient:     Discharge Instructions     You were evaluated in the Emergency Department and after careful evaluation, we did not find any emergent condition requiring admission or further testing in the hospital.  Your exam/testing today was overall reassuring.  Your MRI did not show any abnormalities of your brain that would explain your double vision.  Please follow-up with your primary care doctor and an eye doctor for further evaluation.  As discussed, please avoid driving until your vision is addressed.  Please return to the Emergency Department if you experience any worsening of your condition.  We encourage you to follow up with a primary care provider.  Thank you for allowing [place] to be a part of your care.        Maudie Flakes, MD 01/28/19 2217

## 2019-02-24 ENCOUNTER — Other Ambulatory Visit: Payer: Self-pay

## 2019-02-24 ENCOUNTER — Ambulatory Visit: Payer: Medicare Other | Admitting: Neurology

## 2019-02-24 ENCOUNTER — Encounter: Payer: Self-pay | Admitting: Neurology

## 2019-02-24 VITALS — BP 127/71 | HR 67 | Temp 98.0°F | Ht 64.0 in | Wt 114.5 lb

## 2019-02-24 DIAGNOSIS — R519 Headache, unspecified: Secondary | ICD-10-CM

## 2019-02-24 DIAGNOSIS — M542 Cervicalgia: Secondary | ICD-10-CM | POA: Diagnosis not present

## 2019-02-24 MED ORDER — GABAPENTIN 100 MG PO CAPS: 300.0000 mg | ORAL_CAPSULE | Freq: Three times a day (TID) | ORAL | 11 refills | Status: DC

## 2019-02-24 NOTE — Progress Notes (Signed)
PATIENT: Pamela Mcknight DOB: March 01, 1945  Chief Complaint  Patient presents with  . Follow-up    New room, with friend. 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     HISTORICAL  Pamela Mcknight is a 73 year old female, seen in request by ENT Dr. Benjamine Mola, Su for evaluation of left facial pain, primary care physician is Dr. Kathyrn Lass.  She is accompanied by her longtime friend Pamela Mcknight today's visit on February 24, 2019.  I have reviewed and summarized the referring note from the referring physician.  She had past medical history of kidney transplant in 2013, is on immunosuppressive treatment, CellCept 100 mg twice a day, Prograf 0.5 mg twice a day,  She also reported a history of chickenpox, had shingles vaccination in the past, recently received shingrix in November 2020, planning on receiving second shots in 2 months  On January 15, 2019, she began to feel left side sore throat, deep achy pain, radiating from left neck to left ear, left side of her cheek, constant, by November 27, she had much worsening pain, was seen by her primary care physician, was diagnosed with possible sinus infection, was treated with 10 days course of amoxicillin, without helping her symptoms  By January 27, 2019, every morning when she woke up, she had transient horizontal double vision, lasting for 1 hour, this lasted about 3 weeks, eventually went to emergency room, I personally reviewed MRI of the brain without contrast January 28, 2019, generalized atrophy, supratentorium small vessel disease, no acute abnormality  She was treated with second round of antibiotic Levaquin on December 4, also was seen by ENT Dr. Benjamine Mola, she was treated with gabapentin 300 mg every night, which has helped her symptoms some, gabapentin was able to ease her left side pain, she was able to sleep through the night,  During the day, she still has constant left side  deep achy pain, she used to wear hearing aid, she has difficult to put her hearing aid in, she denies rash broke out,  Laboratory evaluation December 2020, CMP showed elevated creatinine 1.2, normal CBC hemoglobin of 12.2 REVIEW OF SYSTEMS: Full 14 system review of systems performed and notable only for as above All other review of systems were negative.  ALLERGIES: Allergies  Allergen Reactions  . Contrast Media [Iodinated Diagnostic Agents] Rash  . Percocet [Oxycodone-Acetaminophen] Rash  . Sulfonamide Derivatives     angioedema  . Sulfamethoxazole Swelling    Mouth swells  . Iodine-131 Rash  . Oxycodone-Acetaminophen Rash    HOME MEDICATIONS: Current Outpatient Medications  Medication Sig Dispense Refill  . busPIRone (BUSPAR) 10 MG tablet Take 1 tablet (10 mg total) by mouth 2 (two) times daily. 180 tablet 3  . Cholecalciferol (VITAMIN D3) 2000 UNITS TABS Take 1 tablet by mouth daily.     . dapsone 25 MG tablet Take 50 mg by mouth daily. 2 by mouth in the am     . estradiol (ESTRACE) 1 MG tablet Take 1 mg by mouth daily.      . fish oil-omega-3 fatty acids 1000 MG capsule Take 1 g by mouth daily.     Marland Kitchen gabapentin (NEURONTIN) 300 MG capsule Take 300 mg by mouth at bedtime.    . metoprolol tartrate (LOPRESSOR) 25 MG tablet Take 25 mg by mouth 2 (two) times daily.    . mycophenolate (MYFORTIC) 180 MG EC tablet Take 180-360 mg by mouth 2 (two) times daily.  Take 1 tablet (180 mg) in the am and Take 2 tablets (360 mg) in the evening    . Probiotic Product (ALIGN PO) Take 1 capsule by mouth daily.     Marland Kitchen PROGRAF 0.5 MG capsule Take 1-1.5 mg by mouth 2 (two) times daily. Take 2 tablets (1 mg) in the am and Take 3 tablets (1.5 mg) in the evening    . amoxicillin-clavulanate (AUGMENTIN) 875-125 MG tablet Take 1 tablet by mouth 2 (two) times daily.      No current facility-administered medications for this visit.    PAST MEDICAL HISTORY: Past Medical History:  Diagnosis Date  . Colonic  polyp    hyerplastic 2002; negative 2011, Dr Sharlett Iles  . CRF (chronic renal failure)   . Diverticulitis of colon 1991  . Hyperlipidemia   . Hypertension   . Mitral valve disorder    Mitral Regurgitation on 2 D ECHO  . Osteopenia   . Polycystic kidney disease    Dr Erling Cruz; S/P transplant    PAST SURGICAL HISTORY: Past Surgical History:  Procedure Laterality Date  . ABDOMINAL HYSTERECTOMY  1987   for fibroids  . AV FISTULA PLACEMENT  11/02/2010   Left UA   . BILATERAL SALPINGOOPHORECTOMY  2012   [facility], Dr Philis Pique  . COLONOSCOPY  2011   negative  . cosmetic eye surgery  1999  . ESOPHAGOGASTRODUODENOSCOPY    . KIDNEY TRANSPLANT  05/12/2011    [facility]; Dr Roxy Horseman  . TONSILLECTOMY      FAMILY HISTORY: Family History  Problem Relation Age of Onset  . Stroke Father 48  . Polycystic kidney disease Father   . Glaucoma Mother   . Hyperlipidemia Mother   . Macular degeneration Mother   . Osteoarthritis Mother   . Other Mother 62       died with respirtory failure  . Polycystic kidney disease Sister   . Hypertension Brother   . Polycystic kidney disease Paternal Uncle   . Polycystic kidney disease Paternal Grandmother   . Heart attack Maternal Aunt        X2, both > 65  . Heart attack Maternal Uncle        X2, both < 55  . Diabetes Neg Hx   . Colon cancer Neg Hx   . Stomach cancer Neg Hx     SOCIAL HISTORY: Social History   Socioeconomic History  . Marital status: Single    Spouse name: Not on file  . Number of children: 0  . Years of education: Not on file  . Highest education level: Not on file  Occupational History  . Not on file  Tobacco Use  . Smoking status: Former Smoker    Packs/day: 1.00    Years: 10.00    Pack years: 10.00    Quit date: 02/25/1978    Years since quitting: 41.0  . Smokeless tobacco: Never Used  . Tobacco comment: 30 years ago as of 2012  Substance and Sexual Activity  . Alcohol use: Yes    Comment:  rarely  . Drug use: No  . Sexual activity: Not on file  Other Topics Concern  . Not on file  Social History Narrative   The patient is single, she drinks some alcohol, she is a former smoker    Social Determinants of Radio broadcast assistant Strain:   . Difficulty of Paying Living Expenses: Not on file  Food Insecurity:   . Worried About Running  Out of Food in the Last Year: Not on file  . Ran Out of Food in the Last Year: Not on file  Transportation Needs:   . Lack of Transportation (Medical): Not on file  . Lack of Transportation (Non-Medical): Not on file  Physical Activity:   . Days of Exercise per Week: Not on file  . Minutes of Exercise per Session: Not on file  Stress:   . Feeling of Stress : Not on file  Social Connections:   . Frequency of Communication with Friends and Family: Not on file  . Frequency of Social Gatherings with Friends and Family: Not on file  . Attends Religious Services: Not on file  . Active Member of Clubs or Organizations: Not on file  . Attends Archivist Meetings: Not on file  . Marital Status: Not on file  Intimate Partner Violence:   . Fear of Current or Ex-Partner: Not on file  . Emotionally Abused: Not on file  . Physically Abused: Not on file  . Sexually Abused: Not on file     PHYSICAL EXAM   Vitals:   02/24/19 1310  BP: 127/71  Pulse: 67  Temp: 98 F (36.7 C)  Weight: 114 lb 8 oz (51.9 kg)  Height: 5\' 4"  (1.626 m)    Not recorded      Body mass index is 19.65 kg/m.  PHYSICAL EXAMNIATION:  Gen: NAD, conversant, well nourised, well groomed                     Cardiovascular: Regular rate rhythm, no peripheral edema, warm, nontender. Eyes: Conjunctivae clear without exudates or hemorrhage Neck: Supple, no carotid bruits. Pulmonary: Clear to auscultation bilaterally   NEUROLOGICAL EXAM:  MENTAL STATUS: Speech:    Speech is normal; fluent and spontaneous with normal comprehension.  Cognition:      Orientation to time, place and person     Normal recent and remote memory     Normal Attention span and concentration     Normal Language, naming, repeating,spontaneous speech     Fund of knowledge   CRANIAL NERVES: CN II: Visual fields are full to confrontation. Pupils are round equal and briskly reactive to light. CN III, IV, VI: extraocular movement are normal. No ptosis. CN V: Facial sensation is intact to light touch CN VII: Face is symmetric with normal eye closure  CN VIII: Hearing is normal to causal conversation. CN IX, X: Phonation is normal. CN XI: Head turning and shoulder shrug are intact  MOTOR: There is no pronator drift of out-stretched arms. Muscle bulk and tone are normal. Muscle strength is normal.  REFLEXES: Reflexes are 2+ and symmetric at the biceps, triceps, knees, and ankles. Plantar responses are flexor.  SENSORY: Intact to light touch, pinprick and vibratory sensation are intact in fingers and toes.  COORDINATION: There is no trunk or limb dysmetria noted.  GAIT/STANCE: Posture is normal. Gait is steady with normal steps, base, arm swing, and turning. Heel and toe walking are normal. Tandem gait is normal.  Romberg is absent.   DIAGNOSTIC DATA (LABS, IMAGING, TESTING) - I reviewed patient records, labs, notes, testing and imaging myself where available.   ASSESSMENT AND PLAN  ANN-MARIE LEBLEU is a 73 y.o. female   Left-sided neck pain, radiating pain to left side of the throat, left ear  CT of neck soft tissue without contrast to rule out cervical region pathology  Differentiation diagnoses also include shingles,  The history  is not typical for trigeminal neuralgia  Higher dose of gabapentin 100 mg 3 tablets 3 times a day  Laboratory evaluation inflammatory markers, also acetylcholine receptor antibody  Marcial Pacas, M.D. Ph.D.  [facility] [address] [place], [place] [address] Ph: [phone] Fax:  [phone]  CC: Referring Provider

## 2019-03-03 LAB — THYROID PANEL WITH TSH
Free Thyroxine Index: 2 (ref 1.2–4.9)
T3 Uptake Ratio: 31 % (ref 24–39)
T4, Total: 6.3 ug/dL (ref 4.5–12.0)
TSH: 0.752 u[IU]/mL (ref 0.450–4.500)

## 2019-03-03 LAB — ACETYLCHOLINE RECEPTOR AB, ALL
AChR Binding Ab, Serum: 0.04 nmol/L (ref 0.00–0.24)
Acetylchol Block Ab: 20 % (ref 0–25)
Acetylcholine Modulat Ab: <12 % (ref 0–20)

## 2019-03-03 LAB — SAR COV2 SEROLOGY (COVID19)AB(IGG),IA: DiaSorin SARS-CoV-2 Ab, IgG: NEGATIVE

## 2019-03-03 LAB — C-REACTIVE PROTEIN: CRP: 1 mg/L (ref 0–10)

## 2019-03-03 LAB — SEDIMENTATION RATE: Sed Rate: 2 mm/hr (ref 0–40)

## 2019-03-03 LAB — VITAMIN B12: Vitamin B-12: 267 pg/mL (ref 232–1245)

## 2019-03-03 LAB — ANA W/REFLEX: Anti Nuclear Antibody (ANA): NEGATIVE

## 2019-03-04 ENCOUNTER — Telehealth: Payer: Self-pay | Admitting: Neurology

## 2019-03-04 NOTE — Telephone Encounter (Signed)
I did peer to peer review,   CT neck soft tissue approved.  No. [id number]

## 2019-03-04 NOTE — Telephone Encounter (Signed)
Noted [facility] ima aware . Patient will  Have her scan 03/05/2019. Thanks Hinton Dyer

## 2019-03-05 ENCOUNTER — Ambulatory Visit
Admission: RE | Admit: 2019-03-05 | Discharge: 2019-03-05 | Disposition: A | Discharge status: Home / Self Care | Payer: Medicare Other | Source: Ambulatory Visit | Attending: Neurology | Admitting: Neurology

## 2019-03-05 DIAGNOSIS — R519 Headache, unspecified: Secondary | ICD-10-CM

## 2019-03-05 DIAGNOSIS — M542 Cervicalgia: Secondary | ICD-10-CM

## 2019-03-08 ENCOUNTER — Telehealth: Payer: Self-pay | Admitting: Neurology

## 2019-03-08 NOTE — Telephone Encounter (Signed)
-----   Message from Desmond Lope, RN sent at 03/08/2019  8:54 AM EST -----  ----- Message ----- From: Interface, Rad Results In Sent: 03/05/2019   4:27 PM EST To: Marcial Pacas, MD

## 2019-03-08 NOTE — Telephone Encounter (Signed)
I called the patient.  The CT of soft tissues of the neck show a right thyroid nodule that may require further work-up, but the patient is complaining of left-sided throat pain and hoarseness, she has been seen through ENT previously.  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  I will send the report of the CT to the primary care physician so that they are aware of the thyroid nodule  CT neck 03/04/18:  IMPRESSION: Right thyroid 2.6 cm nodule. Ultrasound is recommended if not previously performed. Otherwise no neck mass or adenopathy.  Possible punctate calculus within the mid left submandibular duct without evidence of obstruction.

## 2019-03-11 ENCOUNTER — Other Ambulatory Visit: Payer: Self-pay | Admitting: Family Medicine

## 2019-03-11 DIAGNOSIS — R9389 Abnormal findings on diagnostic imaging of other specified body structures: Secondary | ICD-10-CM

## 2019-03-18 ENCOUNTER — Ambulatory Visit: Payer: Medicare PPO | Attending: Internal Medicine

## 2019-03-18 DIAGNOSIS — Z23 Encounter for immunization: Secondary | ICD-10-CM | POA: Insufficient documentation

## 2019-03-18 NOTE — Progress Notes (Signed)
   Covid-19 Vaccination Clinic  Name:  SHAIMA SAJOUS    MRN: [id number] DOB: [birth year]  03/18/2019  Ms. Potier was observed post Covid-19 immunization for 15 minutes without incidence. She was provided with Vaccine Information Sheet and instruction to access the V-Safe system.   Ms. Parrillo was instructed to call 911 with any severe reactions post vaccine: Marland Kitchen Difficulty breathing  . Swelling of your face and throat  . A fast heartbeat  . A bad rash all over your body  . Dizziness and weakness    Immunizations Administered    Name Date Dose VIS Date Route   Pfizer COVID-19 Vaccine 03/18/2019  5:41 PM 0.3 mL 02/05/2019 Intramuscular   Manufacturer: [place]   Lot: [id number]   [place]: [id number]

## 2019-03-22 ENCOUNTER — Ambulatory Visit
Admission: RE | Admit: 2019-03-22 | Discharge: 2019-03-22 | Disposition: A | Discharge status: Home / Self Care | Payer: Medicare PPO | Source: Ambulatory Visit | Attending: Family Medicine | Admitting: Family Medicine

## 2019-03-22 DIAGNOSIS — R9389 Abnormal findings on diagnostic imaging of other specified body structures: Secondary | ICD-10-CM

## 2019-04-01 ENCOUNTER — Ambulatory Visit: Payer: Medicare PPO | Admitting: Physician Assistant

## 2019-04-01 ENCOUNTER — Other Ambulatory Visit: Payer: Self-pay

## 2019-04-01 ENCOUNTER — Encounter: Payer: Self-pay | Admitting: Physician Assistant

## 2019-04-01 VITALS — BP 122/70 | HR 80 | Temp 97.6°F | Ht 62.75 in | Wt 114.2 lb

## 2019-04-01 DIAGNOSIS — R1319 Other dysphagia: Secondary | ICD-10-CM

## 2019-04-01 DIAGNOSIS — R490 Dysphonia: Secondary | ICD-10-CM | POA: Diagnosis not present

## 2019-04-01 DIAGNOSIS — R1310 Dysphagia, unspecified: Secondary | ICD-10-CM

## 2019-04-01 DIAGNOSIS — J029 Acute pharyngitis, unspecified: Secondary | ICD-10-CM

## 2019-04-01 MED ORDER — FAMOTIDINE 40 MG PO TABS: ORAL_TABLET | ORAL | 3 refills | Status: DC

## 2019-04-01 NOTE — Progress Notes (Signed)
Subjective:    Patient ID: Pamela Mcknight, female    DOB: 06/20/1945, 74 y.o.   MRN: [id number]  HPI [name] is a pleasant 74 year old white female, known to Dr. Carlean Purl, referred by Dr. Kathyrn Lass today for evaluation of persistent severe hoarseness. Patient has history of hypertension, adult onset diabetes mellitus, polycystic kidney disease, chronic anemia, she is status post renal transplant and maintained on tacrolimus.  She has history of colon polyps and diverticulosis.  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 She underwent EGD in October 2018 which was normal, and last colonoscopy 2011 with severe left-sided diverticulosis and no polyps. Patient says her current symptoms started around Thanksgiving with hoarseness.  Initially this was felt secondary to sinusitis and she was given a course of amoxicillin.  She developed some double vision and wound up having an MRI of the head which was negative and says the vision cleared.  She was treated with another course of antibiotics without improvement in symptoms and then referred to ENT.  She says she saw Dr. Benjamine Mola and at that time was complaining of some pain along the left side of her scalp/face and was told that she had trigeminal neuralgia.  She does not believe that she underwent laryngoscopy.  She was started on Neurontin.  Those symptoms resolved, however in the process she had been referred to neurology/Dr. Krista Blue who she says did not feel that she had trigeminal neuralgia.  At that time she had started noticing an persistent sore throat and soreness which would sometimes radiate up into the left ear.  She is also developed a cough over the past couple of months which has been persistent.  She denies any dysphagia or odynophagia.  She has never had any problems with acid reflux heartburn or indigestion. She had CT scan done of the neck/soft tissues which was unremarkable other than a 2.6 cm right thyroid nodule.  The dose of  gabapentin was increased. She has felt worse over the past week to week and a half with ongoing sore throat and cough.  She has been started on Protonix 40 mg daily over the past month without any improvement in symptoms.  She says she is worried about staying on PPI therapy because she is status post renal transplant and understands these medications can be hard on the kidneys. She is frustrated by persistence of symptoms and lack of diagnosis thus far.  She has been told her symptoms may be secondary to GERD.  Review of Systems Pertinent positive and negative review of systems were noted in the above HPI section.  All other review of systems was otherwise negative.  Outpatient Encounter Medications as of 04/01/2019  Medication Sig  . busPIRone (BUSPAR) 10 MG tablet Take 1 tablet (10 mg total) by mouth 2 (two) times daily.  . Cholecalciferol (VITAMIN D3) 2000 UNITS TABS Take 1 tablet by mouth daily.   . dapsone 25 MG tablet Take 50 mg by mouth daily. 2 by mouth in the am   . estradiol (ESTRACE) 1 MG tablet Take 1 mg by mouth daily.    . fish oil-omega-3 fatty acids 1000 MG capsule Take 1 g by mouth daily.   Marland Kitchen gabapentin (NEURONTIN) 100 MG capsule Take 3 capsules (300 mg total) by mouth 3 (three) times daily.  Marland Kitchen gabapentin (NEURONTIN) 300 MG capsule Take 300 mg by mouth at bedtime.  . metoprolol tartrate (LOPRESSOR) 25 MG tablet Take 25 mg by mouth 2 (two) times daily.  Marland Kitchen  mycophenolate (MYFORTIC) 180 MG EC tablet Take 180-360 mg by mouth 2 (two) times daily. Take 1 tablet (180 mg) in the am and Take 2 tablets (360 mg) in the evening  . pantoprazole (PROTONIX) 40 MG tablet Take 40 mg by mouth daily.  . Probiotic Product (ALIGN PO) Take 1 capsule by mouth daily.   Marland Kitchen PROGRAF 0.5 MG capsule Take 1-1.5 mg by mouth 2 (two) times daily. Take 2 tablets (1 mg) in the am and Take 3 tablets (1.5 mg) in the evening  . famotidine (PEPCID) 40 MG tablet Take 1 tablet before dinner daily.  . [DISCONTINUED]  amoxicillin-clavulanate (AUGMENTIN) 875-125 MG tablet Take 1 tablet by mouth 2 (two) times daily.    No facility-administered encounter medications on file as of 04/01/2019.   Allergies  Allergen Reactions  . Contrast Media [Iodinated Diagnostic Agents] Rash  . Percocet [Oxycodone-Acetaminophen] Rash  . Sulfonamide Derivatives     angioedema  . Sulfamethoxazole Swelling    Mouth swells  . Iodine-131 Rash  . Oxycodone-Acetaminophen Rash   Patient Active Problem List   Diagnosis Date Noted  . Neck pain on left side 02/24/2019  . Left facial pain 02/24/2019  . 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 Menopausal symptom 12/02/2016  . Encounter for aftercare following kidney transplant 06/05/2016  . Encounter for monitoring tacrolimus therapy 06/05/2016  . Squamous cell skin cancer 06/06/2013  . Transplant recipient 06/24/2011  . Diabetes mellitus ([place]) 13-Jun-2011  . 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  . Immunosuppressive management encounter following kidney transplant 05/17/2011  . Kidney replaced by transplant 05/14/2011  . Anemia of renal disease 01/10/2011  . Diverticulosis of intestine 01/10/2011  . Mitral valve prolapse 01/10/2011  . RENAL INSUFFICIENCY, CHRONIC 08/17/2009  . HYPERLIPIDEMIA 07/04/2008  . Hypertension goal BP (blood pressure) < 130/80 07/04/2008  . COLONIC POLYPS, HX OF 07/04/2008  . POLYCYSTIC KIDNEY DISEASE 07/22/2007  . DIVERTICULITIS, HX OF 07/22/2007   Social History   Socioeconomic History  . Marital status: Single    Spouse name: Not on file  . Number of children: 0  . Years of education: Not on file  . Highest education level: Not on file  Occupational History  . Not on file  Tobacco Use  . Smoking status: Former Smoker    Packs/day: 1.00    Years: 10.00    Pack years: 10.00    Quit date: 02/25/1978    Years since quitting: 41.1  . Smokeless tobacco: Never Used  . Tobacco comment: 30 years ago as of 2012  Substance and  Sexual Activity  . Alcohol use: Yes    Comment: rarely  . Drug use: No  . Sexual activity: Not on file  Other Topics Concern  . Not on file  Social History Narrative   The patient is single, she drinks some alcohol, she is a former smoker    Social Determinants of Radio broadcast assistant Strain:   . Difficulty of Paying Living Expenses: Not on file  Food Insecurity:   . Worried About Charity fundraiser in the Last Year: Not on file  . Ran Out of Food in the Last Year: Not on file  Transportation Needs:   . Lack of Transportation (Medical): Not on file  . Lack of Transportation (Non-Medical): Not on file  Physical Activity:   . Days of Exercise per Week: Not on file  . Minutes of Exercise per Session: Not on file  Stress:   . Feeling of Stress :  Not on file  Social Connections:   . Frequency of Communication with Friends and Family: Not on file  . Frequency of Social Gatherings with Friends and Family: Not on file  . Attends Religious Services: Not on file  . Active Member of Clubs or Organizations: Not on file  . Attends Archivist Meetings: Not on file  . Marital Status: Not on file  Intimate Partner Violence:   . Fear of Current or Ex-Partner: Not on file  . Emotionally Abused: Not on file  . Physically Abused: Not on file  . Sexually Abused: Not on file    Ms. Spillman's family history includes Glaucoma in her mother; Heart attack in her maternal aunt and maternal uncle; Hyperlipidemia in her mother; Hypertension in her brother; Macular degeneration in her mother; Osteoarthritis in her mother; Other (age of onset: 35) in her mother; Polycystic kidney disease in her father, paternal grandmother, paternal uncle, and sister; Stroke (age of onset: 34) in her father.      Objective:    Vitals:   04/01/19 0951  BP: 122/70  Pulse: 80  Temp: 97.6 F (36.4 C)    Physical Exam Well-developed well-nourished female older white female in no acute distress.   Height, Weight, 114 BMI 20.4  HEENT; nontraumatic normocephalic, EOMI, PER R LA, sclera anicteric, very hoarse Oropharynx; tongue benign, unable to visualize the posterior pharynx well Neck; supple, no JVD Cardiovascular; regular rate and rhythm with S1-S2, no murmur rub or gallop Pulmonary; Clear bilaterally Abdomen; soft, nontender, nondistended, no palpable mass or hepatosplenomegaly, bowel sounds are active Rectal; not done Skin; benign exam, no jaundice rash or appreciable lesions Extremities; no clubbing cyanosis or edema skin warm and dry Neuro/Psych; alert and oriented x4, grossly nonfocal mood and affect appropriate       Assessment & Plan:   #87 74 year old white female with 26-month history of persistent severe hoarseness associated with sore throat and cough.  Patient has had radiation of discomfort up into the left ear at times.  No dysphagia or odynophagia, and no prior history of GERD. She has had a fairly extensive work-up including ENT evaluation at which time it was felt she may have trigeminal neuralgia.  She continues on Neurontin and had dosage increased without any improvement in symptoms though she says it has helped her sleep.  MRI of the brain was done and negative as to etiology. CT soft tissue neck negative. I do not believe she has had laryngoscopy No improvement in symptoms with Protonix 40 mg daily over the past month.  Her symptoms are atypical for GERD or LPR with complaint of ongoing sore throat and cough.  #2 history of polycystic kidney disease status post renal transplant, on Prograf 3.  Adult onset diabetes mellitus 4.  Chronic anemia 5.  Diverticulosis 6.  Hypertension  Plan; continue Protonix 40 mg p.o. every morning short-term, add famotidine 40 mg AC dinner to maximize acid suppression, as a trial to see if improves symptoms. Patient advised to start OTC cough syrup i.e. Robitussin every 6 hours as needed. Will schedule for a manometry/pH study  off PPI therapy to try to accurately assess whether symptoms could be secondary to severe GERD. Will refer back to ENT for follow-up and laryngoscopy   Florentine Diekman Genia Harold PA-C 04/01/2019   Cc: Kathyrn Lass, MD

## 2019-04-01 NOTE — Patient Instructions (Addendum)
If you are age 72 or older, your body mass index should be between 23-30. Your Body mass index is 20.4 kg/m. If this is out of the aforementioned range listed, please consider follow up with your Primary Care Provider.  If you are age 10 or younger, your body mass index should be between 19-25. Your Body mass index is 20.4 kg/m. If this is out of the aformentioned range listed, please consider follow up with your Primary Care Provider.   You have been scheduled for an Esophageal Manometry and 24 Hour pH study at [facility] on 04/14/2019 at 8:30 am am. Please arrive 30 minutes prior to your procedure for registration. You will need to go to outpatient registration (1st floor of the hospital) first. Make certain to bring your insurance cards as well as a complete list of medications.  Please remember the following:  1) Do not take any muscle relaxants, xanax (alprazolam) or ativan for 1 day prior to your test as well as the day of the test.  2) Nothing to eat or drink after 12:00 midnight on the night before your test.  3) Hold all diabetic medications/insulin the morning of the test. You may eat and take your medications after the test.  4) For 7 days prior to your test, do not take: Reglan, Tagamet, Zantac, Phenergan, Axid or Pepcid.  5) You MAY use an antacid such as Rolaids or Tums up to 12 hours prior to your test.  6) Hold all Proton pump inhibitors for 2 weeks  ------------------------------------------------------------------------------------------- ABOUT ESOPHAGEAL MANOMETRY Esophageal manometry (muh-NOM-uh-tree) is a test that gauges how well your esophagus works. Your esophagus is the long, muscular tube that connects your throat to your stomach. Esophageal manometry measures the rhythmic muscle contractions (peristalsis) that occur in your esophagus when you swallow. Esophageal manometry also measures the coordination and force exerted by the muscles of your  esophagus.  During esophageal manometry, a thin, flexible tube (catheter) that contains sensors is passed through your nose, down your esophagus and into your stomach. Esophageal manometry can be helpful in diagnosing some mostly uncommon disorders that affect your esophagus.  Why it's done Esophageal manometry is used to evaluate the movement (motility) of food through the esophagus and into the stomach. The test measures how well the circular bands of muscle (sphincters) at the top and bottom of your esophagus open and close, as well as the pressure, strength and pattern of the wave of esophageal muscle contractions that moves food along.  What you can expect Esophageal manometry is an outpatient procedure done without sedation. Most people tolerate it well. You may be asked to change into a hospital gown before the test starts.  During esophageal manometry  . While you are sitting up, a member of your health care team sprays your throat with a numbing medication or puts numbing gel in your nose or both.  . A catheter is guided through your nose into your esophagus. The catheter may be sheathed in a water-filled sleeve. It doesn't interfere with your breathing. However, your eyes may water, and you may gag. You may have a slight nosebleed from irritation.  . After the catheter is in place, you may be asked to lie on your back on an exam table, or you may be asked to remain seated.  . You then swallow small sips of water. As you do, a computer connected to the catheter records the pressure, strength and pattern of your esophageal muscle contractions.  Marland Kitchen  During the test, you'll be asked to breathe slowly and smoothly, remain as still as possible, and swallow only when you're asked to do so.  . A member of your health care team may move the catheter down into your stomach while the catheter continues its measurements.  . The catheter then is slowly withdrawn.  This test typically takes 30-45 minutes to  complete.  ---------------------------------------------------------------------------------------------- ABOUT 24 HOUR PH PROBE An esophageal pH test measures and records the pH in your esophagus to determine if you have gastroesophageal reflux disease (GERD). The test can also be done to determine the effectiveness of medications or surgical treatment for GERD. What is esophageal reflux? Esophageal reflux is a condition in which stomach acid refluxes or moves back into the esophagus (the "food pipe" leading from the mouth to the stomach). How does the esophageal pH test work? A thin, small tube with an acid sensing device on the tip is gently passed through your nose, down the esophagus ("food tube"), and positioned about 2 inches above the lower esophageal sphincter. The tube is secured to the side of your face with clear tape. The end of the tube exiting from your nose is attached to a portable recorder that is worn on your belt or over your shoulder. The recorder has several buttons on it that you will press to mark certain events. A nurse will review the monitoring instructions with you. Once the test has begun, what do I need to know and do? Marland Kitchen Activity: Follow your usual daily routine. Do not reduce or change your activities during the monitoring period. Doing so can make the monitoring results less useful.  . Note: do not take a tub bath or shower; the equipment can't get wet.  . Eating: Eat your regular meals at the usual times. If you do not eat during the monitoring period, your stomach will not produce acid as usual, and the test results will not be accurate. Eat at least 2 meals a day. Eat foods that tend to increase your symptoms (without making yourself miserable). Avoid snacking. Do not suck on hard candy or lozenges and do not chew gum during the monitoring period.  . Lying down: Remain upright throughout the day. Do not lie down until you go to bed (unless napping or lying down during  the day is part of your daily routine).  . Medications: Continue to follow your doctor's advice regarding medications to avoid during the monitoring period.  . Recording symptoms: Press the appropriate button on your recorder when symptoms occur (as discussed with the nurse).  . Recording events: Record the time you start and stop eating and drinking (anything other than plain water). Record the time you lie down (even if just resting) and when you get back up. The nurse will explain this.  . Unusual symptoms or side effects. If you think you may be experiencing any unusual symptoms or side effects, call your doctor.  You will return the next day to have the tube removed. The information on the recorder will be downloaded to a computer and the results will be analyzed.  After completion of the study Resume your normal diet and medications. Lozenges or hard candy may help ease any sore throat caused by the tube.   It will take at least 2 weeks to receive the results of this test from your physician.   Due to recent changes in healthcare laws, you may see the results of your imaging and laboratory studies on  MyChart before your provider has had a chance to review them.  We understand that in some cases there may be results that are confusing or concerning to you. Not all laboratory results come back in the same time frame and the provider may be waiting for multiple results in order to interpret others.  Please give [place] 48 hours in order for your provider to thoroughly review all the results before contacting the office for clarification of your results.   Continue taking Protonix 40 mg in the am We are adding Pepcid 40 mg before dinner Use over the counter Robitussin every 6 hours for cough  We will send a referral to Dr Melene Plan for ENT

## 2019-04-05 ENCOUNTER — Telehealth: Payer: Self-pay | Admitting: General Surgery

## 2019-04-05 NOTE — Telephone Encounter (Signed)
Notified the patient that she is scheduled for an appointment with Dr Benjamine Mola on 04/22/2019 @1 :65. The patient verbalized understanding.

## 2019-04-06 ENCOUNTER — Ambulatory Visit: Payer: Medicare PPO | Attending: Internal Medicine

## 2019-04-06 DIAGNOSIS — Z23 Encounter for immunization: Secondary | ICD-10-CM | POA: Insufficient documentation

## 2019-04-06 NOTE — Progress Notes (Signed)
   Covid-19 Vaccination Clinic  Name:  Pamela Mcknight    MRN: [id number] DOB: 1945-05-17  04/06/2019  [name] was observed post Covid-19 immunization for 15 minutes without incidence. She was provided with Vaccine Information Sheet and instruction to access the V-Safe system.   [name] was instructed to call 911 with any severe reactions post vaccine: Marland Kitchen Difficulty breathing  . Swelling of your face and throat  . A fast heartbeat  . A bad rash all over your body  . Dizziness and weakness    Immunizations Administered    Name Date Dose VIS Date Route   Pfizer COVID-19 Vaccine 04/06/2019 11:06 AM 0.3 mL 02/05/2019 Intramuscular   Manufacturer: [place]   Lot: [id number]   [place]: [id number]

## 2019-04-12 NOTE — Telephone Encounter (Signed)
Pt is scheduled for an esophageal manometry and stated that she was not aware that she had to stay the night.  Please clarify.

## 2019-04-12 NOTE — Telephone Encounter (Signed)
Spoke with the patient and expressed to her usually it is a 2 day test when you have the manometry with ph

## 2019-04-13 ENCOUNTER — Other Ambulatory Visit (HOSPITAL_COMMUNITY)
Admission: RE | Admit: 2019-04-13 | Discharge: 2019-04-13 | Disposition: A | Discharge status: Home / Self Care | Payer: Medicare PPO | Source: Ambulatory Visit | Attending: Internal Medicine | Admitting: Internal Medicine

## 2019-04-13 DIAGNOSIS — Z01812 Encounter for preprocedural laboratory examination: Secondary | ICD-10-CM | POA: Diagnosis present

## 2019-04-13 DIAGNOSIS — Z20822 Contact with and (suspected) exposure to covid-19: Secondary | ICD-10-CM | POA: Diagnosis not present

## 2019-04-13 LAB — SARS CORONAVIRUS 2 (TAT 6-24 HRS): SARS Coronavirus 2: NEGATIVE

## 2019-04-14 ENCOUNTER — Encounter (HOSPITAL_COMMUNITY)
Admission: RE | Disposition: A | Discharge status: Home / Self Care | Payer: Self-pay | Source: Home / Self Care | Attending: Internal Medicine

## 2019-04-14 ENCOUNTER — Ambulatory Visit (HOSPITAL_COMMUNITY)
Admission: RE | Admit: 2019-04-14 | Discharge: 2019-04-14 | Disposition: A | Discharge status: Home / Self Care | Payer: Medicare PPO | Attending: Internal Medicine | Admitting: Internal Medicine

## 2019-04-14 DIAGNOSIS — R05 Cough: Secondary | ICD-10-CM | POA: Diagnosis not present

## 2019-04-14 DIAGNOSIS — R0789 Other chest pain: Secondary | ICD-10-CM

## 2019-04-14 DIAGNOSIS — J029 Acute pharyngitis, unspecified: Secondary | ICD-10-CM | POA: Insufficient documentation

## 2019-04-14 DIAGNOSIS — R1310 Dysphagia, unspecified: Secondary | ICD-10-CM

## 2019-04-14 DIAGNOSIS — R059 Cough, unspecified: Secondary | ICD-10-CM

## 2019-04-14 DIAGNOSIS — R490 Dysphonia: Secondary | ICD-10-CM | POA: Diagnosis not present

## 2019-04-14 DIAGNOSIS — R1319 Other dysphagia: Secondary | ICD-10-CM

## 2019-04-14 HISTORY — PX: PH IMPEDANCE STUDY: SHX5565

## 2019-04-14 HISTORY — PX: ESOPHAGEAL MANOMETRY: SHX5429

## 2019-04-14 SURGERY — MANOMETRY, ESOPHAGUS
Anesthesia: Topical

## 2019-04-14 MED ORDER — LIDOCAINE VISCOUS HCL 2 % MT SOLN
OROMUCOSAL | Status: AC
  Filled 2019-04-14: qty 15

## 2019-04-14 SURGICAL SUPPLY — 2 items
FACESHIELD LNG OPTICON STERILE (SAFETY) IMPLANT
GLOVE BIO SURGEON STRL SZ8 (GLOVE) ×6 IMPLANT

## 2019-04-14 NOTE — Progress Notes (Signed)
Esophageal Manometry done per protocol. Pt tolerated well without complication or distress. Ph probe placed per protocol without complication. :Pt tolerated well. Pt educated using teach back regarding study and monitor. Pt voiced understanding and questions answered.  Pt to return tomorrow to have probe removed and study downloaded

## 2019-04-16 ENCOUNTER — Encounter: Payer: Self-pay | Admitting: *Deleted

## 2019-04-16 DIAGNOSIS — R1319 Other dysphagia: Secondary | ICD-10-CM

## 2019-04-16 DIAGNOSIS — R1310 Dysphagia, unspecified: Secondary | ICD-10-CM

## 2019-04-16 DIAGNOSIS — R0789 Other chest pain: Secondary | ICD-10-CM

## 2019-04-20 DIAGNOSIS — R05 Cough: Secondary | ICD-10-CM

## 2019-04-20 DIAGNOSIS — R059 Cough, unspecified: Secondary | ICD-10-CM

## 2019-04-22 ENCOUNTER — Other Ambulatory Visit: Payer: Self-pay | Admitting: Otolaryngology

## 2019-04-22 ENCOUNTER — Other Ambulatory Visit (HOSPITAL_COMMUNITY): Payer: Self-pay | Admitting: Otolaryngology

## 2019-04-22 DIAGNOSIS — R1310 Dysphagia, unspecified: Secondary | ICD-10-CM

## 2019-04-23 ENCOUNTER — Telehealth: Payer: Self-pay | Admitting: Physician Assistant

## 2019-04-23 NOTE — Telephone Encounter (Signed)
Patient notified of esophageal mano results. Discussed with patient as she could not read the MyChart message.  She saw Dr. Benjamine Mola and he ordered a barium swallow. Dr. Carlean Purl, the patient wants to know if this is an appropriate course of action? Please advise.

## 2019-04-23 NOTE — Telephone Encounter (Signed)
Ba swallow seems like a reasonable test  Were you able to see what I put in the result notes that I sent to amy ?- she had seen the patient and ordered the tests so had routed to her  Anyway - bottom line is no GERD problem and the esophagus squeezes strongly but does not seem to be causing sxs

## 2019-04-23 NOTE — Telephone Encounter (Signed)
Yes, I was able to see your note and relayed this to the patient. Patient notified of your recommendation.

## 2019-04-27 ENCOUNTER — Encounter: Payer: Self-pay | Admitting: Neurology

## 2019-04-27 ENCOUNTER — Other Ambulatory Visit: Payer: Self-pay

## 2019-04-27 ENCOUNTER — Ambulatory Visit: Payer: Medicare PPO | Admitting: Neurology

## 2019-04-27 VITALS — BP 126/60 | HR 72 | Temp 97.6°F | Ht 62.0 in | Wt 114.0 lb

## 2019-04-27 DIAGNOSIS — Z940 Kidney transplant status: Secondary | ICD-10-CM

## 2019-04-27 DIAGNOSIS — M542 Cervicalgia: Secondary | ICD-10-CM | POA: Diagnosis not present

## 2019-04-27 NOTE — Progress Notes (Signed)
PATIENT: Pamela Mcknight DOB: 04-23-1945  Chief Complaint  Patient presents with  . Neck/Facial Pain    She is here with her friend, Pamela Mcknight. Reports resolution of her facial and neck pain. She has continued to have cough, scratchy voice and sore throat. She has been seen by ENT and has a barium swallow test pending. ENT has also recommended she see a speech pathology. She is still taking gabapentin 147m, two tabs in am and one tab midday prn. She also takes gabapentin 3057m one cap QHS.     HISTORICAL  Pamela LISLEs a [age]ear old female, seen in request by ENT Dr. TeBenjamine MolaSu for evaluation of left facial pain, primary care physician is Dr. LiKathyrn Lass She is accompanied by her longtime friend SuCollie Siadoday's visit on February 24, 2019.  I have reviewed and summarized the referring note from the referring physician.  She had past medical history of kidney transplant in 2013, is on immunosuppressive treatment, CellCept 100 mg twice a day, Prograf 0.5 mg twice a day,  She also reported a history of chickenpox, had shingles vaccination in the past, recently received shingrix in November 2020, planning on receiving second shots in 2 months  On January 15, 2019, she began to feel left side sore throat, deep achy pain, radiating from left neck to she can worry left side of her cheek, constant, by November 27, she had much worsening pain, was seen by her primary care physician, was diagnosed with possible sinus infection, was treated with 10 days course of amoxicillin, without helping her symptoms.  By January 27, 2019, every morning when she woke up, she had transient horizontal double vision, lasting for 1 hour, this lasted about 3 weeks, eventually went to emergency room, I personally reviewed MRI of the brain without contrast January 28, 2019, generalized atrophy, supratentorium small vessel disease, no acute abnormality  She was treated with second round of antibiotic Levaquin on December 4,  also was seen by ENT Dr. TeBenjamine Molashe was treated with gabapentin 300 mg every night, which has helped her symptoms some, gabapentin was able to ease her left side pain, she was able to sleep through the night,  During the day, she still has constant left side deep achy pain, she used to wear hearing aid, she has difficult to put her hearing aid in, she denies rash broke out,  Laboratory evaluation December 2020, CMP showed elevated creatinine 1.2, normal CBC hemoglobin of 12.2   UPDATE April 27 2019: After gabapentin treatment, her symptoms overall has much improved, she no longer have sharp radiating pain at her left jaw or left ear, she can wear hearing aid now, she has moderate oropharyngeal discomfort, was seen by ENT, no structural abnormality found, she also complains of hoarse voice, [facility] 2021, right thyroid 2.6 cm nodule, ultrasound is recommended, Ultrasound of thyroid showed multinodular goiter, recommended percutaneous sampling will continue with follow-up Laboratory evaluation in December 2020 showed normal negative CBC, ANA, B12, thyroid functional panel, ESR, C-reactive protein, myasthenia gravis panel, CMP showed mild elevated creatinine 1.21, GFR of 45  REVIEW OF SYSTEMS: Full 14 system review of systems performed and notable only for as above All other review of systems were negative.  ALLERGIES: Allergies  Allergen Reactions  . Contrast Media [Iodinated Diagnostic Agents] Rash  . Percocet [Oxycodone-Acetaminophen] Rash  . Sulfonamide Derivatives     angioedema  .  Sulfamethoxazole Swelling    Mouth swells  . Iodine-131 Rash  . Oxycodone-Acetaminophen Rash    HOME MEDICATIONS: Current Outpatient Medications  Medication Sig Dispense Refill  . busPIRone (BUSPAR) 10 MG tablet Take 1 tablet (10 mg total) by mouth 2 (two) times daily. 180 tablet 3  . Cholecalciferol (VITAMIN D3)  2000 UNITS TABS Take 1 tablet by mouth daily.     . dapsone 25 MG tablet Take 50 mg by mouth daily. 2 by mouth in the am     . estradiol (ESTRACE) 1 MG tablet Take 1 mg by mouth daily.      . fish oil-omega-3 fatty acids 1000 MG capsule Take 1 g by mouth daily.     Marland Kitchen gabapentin (NEURONTIN) 100 MG capsule Take 3 capsules (300 mg total) by mouth 3 (three) times daily. 270 capsule 11  . gabapentin (NEURONTIN) 300 MG capsule Take 300 mg by mouth at bedtime.    . metoprolol tartrate (LOPRESSOR) 25 MG tablet Take 25 mg by mouth 2 (two) times daily.    . mycophenolate (MYFORTIC) 180 MG EC tablet Take 180-360 mg by mouth 2 (two) times daily. Take 1 tablet (180 mg) in the am and Take 2 tablets (360 mg) in the evening    . Probiotic Product (ALIGN PO) Take 1 capsule by mouth daily.     Marland Kitchen PROGRAF 0.5 MG capsule Take 1-1.5 mg by mouth 2 (two) times daily. Take 2 tablets (1 mg) in the am and Take 3 tablets (1.5 mg) in the evening     No current facility-administered medications for this visit.    PAST MEDICAL HISTORY: Past Medical History:  Diagnosis Date  . Colonic polyp    hyerplastic 2002; negative 2011, Dr Sharlett Iles  . CRF (chronic renal failure)   . Diverticulitis of colon 1991  . Hyperlipidemia   . Hypertension   . Mitral valve disorder    Mitral Regurgitation on 2 D ECHO  . Osteopenia   . Polycystic kidney disease    Dr Erling Cruz; S/P transplant    PAST SURGICAL HISTORY: Past Surgical History:  Procedure Laterality Date  . ABDOMINAL HYSTERECTOMY  1987   for fibroids  . AV FISTULA PLACEMENT  11/02/2010   Left UA   . BILATERAL SALPINGOOPHORECTOMY  2012   [facility], Dr Philis Pique  . COLONOSCOPY  2011   negative  . cosmetic eye surgery  1999  . ESOPHAGEAL MANOMETRY N/A 04/14/2019   Procedure: ESOPHAGEAL MANOMETRY (EM);  Surgeon: Gatha Mayer, MD;  Location: [facility];  Service: Endoscopy;  Laterality: N/A;  . ESOPHAGOGASTRODUODENOSCOPY    . KIDNEY TRANSPLANT  05/12/2011     [facility]; Dr Roxy Horseman  . [place] IMPEDANCE STUDY  04/14/2019   Procedure: [place] IMPEDANCE STUDY;  Surgeon: Gatha Mayer, MD;  Location: [facility];  Service: Endoscopy;;  . TONSILLECTOMY      FAMILY HISTORY: Family History  Problem Relation Age of Onset  . Stroke Father 27  . Polycystic kidney disease Father   . Glaucoma Mother   . Hyperlipidemia Mother   . Macular degeneration Mother   . Osteoarthritis Mother   . Other Mother 83       died with respirtory failure  . Polycystic kidney disease Sister   . Hypertension Brother   . Polycystic kidney disease Paternal Uncle   . Polycystic kidney disease Paternal Grandmother   . Heart attack Maternal Aunt        X2, both > 65  .  Heart attack Maternal Uncle        X2, both < 55  . Diabetes Neg Hx   . Colon cancer Neg Hx   . Stomach cancer Neg Hx     SOCIAL HISTORY: Social History   Socioeconomic History  . Marital status: Single    Spouse name: Not on file  . Number of children: 0  . Years of education: Not on file  . Highest education level: Not on file  Occupational History  . Not on file  Tobacco Use  . Smoking status: Former Smoker    Packs/day: 1.00    Years: 10.00    Pack years: 10.00    Quit date: 02/25/1978    Years since quitting: 41.1  . Smokeless tobacco: Never Used  . Tobacco comment: 30 years ago as of 2012  Substance and Sexual Activity  . Alcohol use: Yes    Comment: rarely  . Drug use: No  . Sexual activity: Not on file  Other Topics Concern  . Not on file  Social History Narrative   The patient is single, she drinks some alcohol, she is a former smoker    Social Determinants of Radio broadcast assistant Strain:   . Difficulty of Paying Living Expenses: Not on file  Food Insecurity:   . Worried About Charity fundraiser in the Last Year: Not on file  . Ran Out of Food in the Last Year: Not on file  Transportation Needs:   . Lack of Transportation (Medical): Not on file  . Lack  of Transportation (Non-Medical): Not on file  Physical Activity:   . Days of Exercise per Week: Not on file  . Minutes of Exercise per Session: Not on file  Stress:   . Feeling of Stress : Not on file  Social Connections:   . Frequency of Communication with Friends and Family: Not on file  . Frequency of Social Gatherings with Friends and Family: Not on file  . Attends Religious Services: Not on file  . Active Member of Clubs or Organizations: Not on file  . Attends Archivist Meetings: Not on file  . Marital Status: Not on file  Intimate Partner Violence:   . Fear of Current or Ex-Partner: Not on file  . Emotionally Abused: Not on file  . Physically Abused: Not on file  . Sexually Abused: Not on file     PHYSICAL EXAM   Vitals:   04/27/19 1423  BP: 126/60  Pulse: 72  Temp: 97.6 F (36.4 C)  Weight: 114 lb (51.7 kg)  Height: '5\' 2"'  (1.575 m)    Not recorded      Body mass index is 20.85 kg/m.  PHYSICAL EXAMNIATION:  Gen: NAD, conversant, well nourised, well groomed                     Cardiovascular: Regular rate rhythm, no peripheral edema, warm, nontender. Eyes: Conjunctivae clear without exudates or hemorrhage Neck: Supple, no carotid bruits. Pulmonary: Clear to auscultation bilaterally   NEUROLOGICAL EXAM:  MENTAL STATUS: Speech:    Speech is normal; fluent and spontaneous with normal comprehension.  Cognition:     Orientation to time, place and person     Normal recent and remote memory     Normal Attention span and concentration     Normal Language, naming, repeating,spontaneous speech     Fund of knowledge   CRANIAL NERVES: CN II: Visual fields are full to  confrontation. Pupils are round equal and briskly reactive to light. CN III, IV, VI: extraocular movement are normal. No ptosis. CN V: Facial sensation is intact to light touch CN VII: Face is symmetric with normal eye closure  CN VIII: Hearing is normal to causal conversation. CN IX,  X: Phonation is normal. CN XI: Head turning and shoulder shrug are intact  MOTOR: There is no pronator drift of out-stretched arms. Muscle bulk and tone are normal. Muscle strength is normal.  REFLEXES: Reflexes are 2+ and symmetric at the biceps, triceps, knees, and ankles. Plantar responses are flexor.  SENSORY: Intact to light touch, pinprick and vibratory sensation are intact in fingers and toes.  COORDINATION: There is no trunk or limb dysmetria noted.  GAIT/STANCE: Posture is normal. Gait is steady with normal steps, base, arm swing, and turning. Heel and toe walking are normal. Tandem gait is normal.  Romberg is absent.   DIAGNOSTIC DATA (LABS, IMAGING, TESTING) - I reviewed patient records, labs, notes, testing and imaging myself where available.   ASSESSMENT AND PLAN  CRICKET GOODLIN is a 74 y.o. female   Oropharyngeal pain, radiating pain to left side of the throat, left ear  CT of neck soft tissue showed no significant pathology, other than the incidental findings of multinodular goiter on the right side  Differentiation diagnoses of her left jaw/oropharyngeal pain remain shingles,  Continue gabapentin  Marcial Pacas, M.D. Ph.D.  [facility] [address] [place],  [address] Ph: [phone] Fax: [phone]  CC: Referring Provider

## 2019-04-29 ENCOUNTER — Ambulatory Visit (HOSPITAL_COMMUNITY)
Admission: RE | Admit: 2019-04-29 | Discharge: 2019-04-29 | Disposition: A | Discharge status: Home / Self Care | Payer: Medicare PPO | Source: Ambulatory Visit | Attending: Otolaryngology | Admitting: Otolaryngology

## 2019-04-29 ENCOUNTER — Other Ambulatory Visit: Payer: Self-pay

## 2019-04-29 DIAGNOSIS — R1310 Dysphagia, unspecified: Secondary | ICD-10-CM | POA: Diagnosis not present

## 2019-04-29 DIAGNOSIS — K219 Gastro-esophageal reflux disease without esophagitis: Secondary | ICD-10-CM | POA: Diagnosis not present

## 2019-05-18 DIAGNOSIS — R490 Dysphonia: Secondary | ICD-10-CM | POA: Diagnosis not present

## 2019-05-20 DIAGNOSIS — R490 Dysphonia: Secondary | ICD-10-CM | POA: Diagnosis not present

## 2019-06-20 ENCOUNTER — Other Ambulatory Visit: Payer: Self-pay | Admitting: Internal Medicine

## 2019-06-21 NOTE — Telephone Encounter (Signed)
Please advise Sir? 

## 2019-06-22 ENCOUNTER — Other Ambulatory Visit: Payer: Self-pay | Admitting: Physician Assistant

## 2019-06-24 DIAGNOSIS — Z4822 Encounter for aftercare following kidney transplant: Secondary | ICD-10-CM | POA: Diagnosis not present

## 2019-06-24 DIAGNOSIS — C4490 Unspecified malignant neoplasm of skin, unspecified: Secondary | ICD-10-CM | POA: Diagnosis not present

## 2019-06-24 DIAGNOSIS — Z940 Kidney transplant status: Secondary | ICD-10-CM | POA: Diagnosis not present

## 2019-06-24 DIAGNOSIS — Z5181 Encounter for therapeutic drug level monitoring: Secondary | ICD-10-CM | POA: Diagnosis not present

## 2019-06-24 DIAGNOSIS — Z79899 Other long term (current) drug therapy: Secondary | ICD-10-CM | POA: Diagnosis not present

## 2019-06-24 DIAGNOSIS — Z23 Encounter for immunization: Secondary | ICD-10-CM | POA: Diagnosis not present

## 2019-06-24 DIAGNOSIS — I10 Essential (primary) hypertension: Secondary | ICD-10-CM | POA: Diagnosis not present

## 2019-06-24 DIAGNOSIS — E559 Vitamin D deficiency, unspecified: Secondary | ICD-10-CM | POA: Diagnosis not present

## 2019-06-24 DIAGNOSIS — Z85828 Personal history of other malignant neoplasm of skin: Secondary | ICD-10-CM | POA: Diagnosis not present

## 2019-06-24 DIAGNOSIS — Z87891 Personal history of nicotine dependence: Secondary | ICD-10-CM | POA: Diagnosis not present

## 2019-07-02 DIAGNOSIS — R490 Dysphonia: Secondary | ICD-10-CM | POA: Diagnosis not present

## 2019-07-02 DIAGNOSIS — J385 Laryngeal spasm: Secondary | ICD-10-CM | POA: Diagnosis not present

## 2019-07-02 DIAGNOSIS — J383 Other diseases of vocal cords: Secondary | ICD-10-CM | POA: Diagnosis not present

## 2019-07-21 DIAGNOSIS — Z1231 Encounter for screening mammogram for malignant neoplasm of breast: Secondary | ICD-10-CM | POA: Diagnosis not present

## 2019-09-02 DIAGNOSIS — Z85828 Personal history of other malignant neoplasm of skin: Secondary | ICD-10-CM | POA: Diagnosis not present

## 2019-09-02 DIAGNOSIS — L814 Other melanin hyperpigmentation: Secondary | ICD-10-CM | POA: Diagnosis not present

## 2019-09-02 DIAGNOSIS — D044 Carcinoma in situ of skin of scalp and neck: Secondary | ICD-10-CM | POA: Diagnosis not present

## 2019-09-02 DIAGNOSIS — L820 Inflamed seborrheic keratosis: Secondary | ICD-10-CM | POA: Diagnosis not present

## 2019-09-02 DIAGNOSIS — D485 Neoplasm of uncertain behavior of skin: Secondary | ICD-10-CM | POA: Diagnosis not present

## 2019-09-02 DIAGNOSIS — L578 Other skin changes due to chronic exposure to nonionizing radiation: Secondary | ICD-10-CM | POA: Diagnosis not present

## 2019-09-02 DIAGNOSIS — D225 Melanocytic nevi of trunk: Secondary | ICD-10-CM | POA: Diagnosis not present

## 2019-09-02 DIAGNOSIS — L821 Other seborrheic keratosis: Secondary | ICD-10-CM | POA: Diagnosis not present

## 2019-09-10 DIAGNOSIS — H40023 Open angle with borderline findings, high risk, bilateral: Secondary | ICD-10-CM | POA: Diagnosis not present

## 2019-09-10 DIAGNOSIS — H5000 Unspecified esotropia: Secondary | ICD-10-CM | POA: Diagnosis not present

## 2019-09-14 DIAGNOSIS — Z822 Family history of deafness and hearing loss: Secondary | ICD-10-CM | POA: Diagnosis not present

## 2019-09-14 DIAGNOSIS — H903 Sensorineural hearing loss, bilateral: Secondary | ICD-10-CM | POA: Diagnosis not present

## 2019-10-13 DIAGNOSIS — Z940 Kidney transplant status: Secondary | ICD-10-CM | POA: Diagnosis not present

## 2019-10-13 DIAGNOSIS — R531 Weakness: Secondary | ICD-10-CM | POA: Diagnosis not present

## 2019-10-19 DIAGNOSIS — R634 Abnormal weight loss: Secondary | ICD-10-CM | POA: Diagnosis not present

## 2019-10-19 DIAGNOSIS — Z940 Kidney transplant status: Secondary | ICD-10-CM | POA: Diagnosis not present

## 2019-10-19 DIAGNOSIS — E785 Hyperlipidemia, unspecified: Secondary | ICD-10-CM | POA: Diagnosis not present

## 2019-10-19 DIAGNOSIS — R490 Dysphonia: Secondary | ICD-10-CM | POA: Diagnosis not present

## 2019-10-19 DIAGNOSIS — C4490 Unspecified malignant neoplasm of skin, unspecified: Secondary | ICD-10-CM | POA: Diagnosis not present

## 2019-11-18 DIAGNOSIS — D044 Carcinoma in situ of skin of scalp and neck: Secondary | ICD-10-CM | POA: Diagnosis not present

## 2019-12-23 ENCOUNTER — Encounter: Payer: Self-pay | Admitting: Internal Medicine

## 2020-01-07 DIAGNOSIS — Z940 Kidney transplant status: Secondary | ICD-10-CM | POA: Diagnosis not present

## 2020-02-07 ENCOUNTER — Ambulatory Visit (AMBULATORY_SURGERY_CENTER): Payer: Self-pay | Admitting: *Deleted

## 2020-02-07 ENCOUNTER — Other Ambulatory Visit: Payer: Self-pay

## 2020-02-07 VITALS — Ht 62.0 in | Wt 111.0 lb

## 2020-02-07 DIAGNOSIS — Z1211 Encounter for screening for malignant neoplasm of colon: Secondary | ICD-10-CM

## 2020-02-07 NOTE — Progress Notes (Signed)
Patient is here in-person for PV. Patient denies any allergies to eggs or soy. Patient denies any problems with anesthesia/sedation. Patient denies any oxygen use at home. Patient denies taking any diet/weight loss medications or blood thinners. Patient is not being treated for MRSA or C-diff. Patient is aware of our care-partner policy and TMYTR-17 safety protocol. EMMI education assigned to the patient for the procedure, sent to [place].   COVID-19 vaccines completed on 09/2019 booster, per patient.

## 2020-02-08 ENCOUNTER — Encounter: Payer: Self-pay | Admitting: Internal Medicine

## 2020-02-10 DIAGNOSIS — S0100XA Unspecified open wound of scalp, initial encounter: Secondary | ICD-10-CM | POA: Diagnosis not present

## 2020-02-21 ENCOUNTER — Other Ambulatory Visit: Payer: Self-pay

## 2020-02-21 ENCOUNTER — Ambulatory Visit (AMBULATORY_SURGERY_CENTER): Payer: Medicare PPO | Admitting: Internal Medicine

## 2020-02-21 ENCOUNTER — Encounter: Payer: Self-pay | Admitting: Internal Medicine

## 2020-02-21 VITALS — BP 136/73 | HR 60 | Temp 97.8°F | Resp 10 | Ht 62.0 in | Wt 111.0 lb

## 2020-02-21 DIAGNOSIS — Z1211 Encounter for screening for malignant neoplasm of colon: Secondary | ICD-10-CM

## 2020-02-21 DIAGNOSIS — N186 End stage renal disease: Secondary | ICD-10-CM | POA: Diagnosis not present

## 2020-02-21 DIAGNOSIS — I10 Essential (primary) hypertension: Secondary | ICD-10-CM | POA: Diagnosis not present

## 2020-02-21 MED ORDER — SODIUM CHLORIDE 0.9 % IV SOLN: 500.0000 mL | INTRAVENOUS | Status: DC

## 2020-02-21 NOTE — Progress Notes (Signed)
Report to PACU, RN, vss, BBS= Clear.  

## 2020-02-21 NOTE — Patient Instructions (Addendum)
I did not see any polyps or cancer.  You still have diverticulosis as we would expect.  You do not need further routine colon cancer screening tests.  If you have problems, I hope you do not, we would consider a diagnostic colonoscopy if appropriate.  I appreciate the opportunity to care for you.  Dellis Anes, MD, [facility]  Handouts given for diverticulosis and hemorrhoids.  YOU HAD AN ENDOSCOPIC PROCEDURE TODAY AT THE [facility]:   Refer to the procedure report that was given to you for any specific questions about what was found during the examination.  If the procedure report does not answer your questions, please call your gastroenterologist to clarify.  If you requested that your care partner not be given the details of your procedure findings, then the procedure report has been included in a sealed envelope for you to review at your convenience later.  YOU SHOULD EXPECT: Some feelings of bloating in the abdomen. Passage of more gas than usual.  Walking can help get rid of the air that was put into your GI tract during the procedure and reduce the bloating. If you had a lower endoscopy (such as a colonoscopy or flexible sigmoidoscopy) you may notice spotting of blood in your stool or on the toilet paper. If you underwent a bowel prep for your procedure, you may not have a normal bowel movement for a few days.  Please Note:  You might notice some irritation and congestion in your nose or some drainage.  This is from the oxygen used during your procedure.  There is no need for concern and it should clear up in a day or so.  SYMPTOMS TO REPORT IMMEDIATELY:   Following lower endoscopy (colonoscopy or flexible sigmoidoscopy):  Excessive amounts of blood in the stool  Significant tenderness or worsening of abdominal pains  Swelling of the abdomen that is new, acute  Fever of 100F or higher  For urgent or emergent issues, a gastroenterologist can be reached at any hour by  calling [phone]. Do not use MyChart messaging for urgent concerns.    DIET:  We do recommend a small meal at first, but then you may proceed to your regular diet.  Drink plenty of fluids but you should avoid alcoholic beverages for 24 hours.  ACTIVITY:  You should plan to take it easy for the rest of today and you should NOT DRIVE or use heavy machinery until tomorrow (because of the sedation medicines used during the test).    FOLLOW UP: Our staff will call the number listed on your records 48-72 hours following your procedure to check on you and address any questions or concerns that you may have regarding the information given to you following your procedure. If we do not reach you, we will leave a message.  We will attempt to reach you two times.  During this call, we will ask if you have developed any symptoms of COVID 19. If you develop any symptoms (ie: fever, flu-like symptoms, shortness of breath, cough etc.) before then, please call [phone].  If you test positive for Covid 19 in the 2 weeks post procedure, please call and report this information to [place].    If any biopsies were taken you will be contacted by phone or by letter within the next 1-3 weeks.  Please call us at [phone] if you have not heard about the biopsies in 3 weeks.    SIGNATURES/CONFIDENTIALITY: You and/or your care partner have signed  paperwork which will be entered into your electronic medical record.  These signatures attest to the fact that that the information above on your After Visit Summary has been reviewed and is understood.  Full responsibility of the confidentiality of this discharge information lies with you and/or your care-partner.

## 2020-02-21 NOTE — Op Note (Signed)
[facility] Patient Name: Pamela Mcknight Procedure Date: 02/21/2020 11:51 AM MRN: [id number] Endoscopist: Iva Boop , MD Age: 74 Referring MD:  Date of Birth: 05/20/1945 Gender: Female Account #: [id number] Procedure:                Colonoscopy Indications:              Screening for colorectal malignant neoplasm, Last                            colonoscopy: 2011 Medicines:                Propofol per Anesthesia, Monitored Anesthesia Care Procedure:                Pre-Anesthesia Assessment:                           - Prior to the procedure, a History and Physical                            was performed, and patient medications and                            allergies were reviewed. The patient's tolerance of                            previous anesthesia was also reviewed. The risks                            and benefits of the procedure and the sedation                            options and risks were discussed with the patient.                            All questions were answered, and informed consent                            was obtained. Prior Anticoagulants: The patient has                            taken no previous anticoagulant or antiplatelet                            agents. ASA Grade Assessment: II - A patient with                            mild systemic disease. After reviewing the risks                            and benefits, the patient was deemed in                            satisfactory condition to undergo the procedure.  After obtaining informed consent, the colonoscope                            was passed under direct vision. Throughout the                            procedure, the patient's blood pressure, pulse, and                            oxygen saturations were monitored continuously. The                            Olympus [id number]) Colonoscope was                            introduced through  the anus and advanced to the the                            cecum, identified by appendiceal orifice and                            ileocecal valve. The colonoscopy was somewhat                            difficult due to a tortuous colon. Successful                            completion of the procedure was aided by changing                            the patient to a supine position and straightening                            and shortening the scope to obtain bowel loop                            reduction. The patient tolerated the procedure                            well. The quality of the bowel preparation was                            good. The ileocecal valve, appendiceal orifice, and                            rectum were photographed. The bowel preparation                            used was Miralax via split dose instruction. Scope In: 12:00:40 PM Scope Out: 12:14:21 PM Scope Withdrawal Time: 0 hours 8 minutes 58 seconds  Total Procedure Duration: 0 hours 13 minutes 41 seconds  Findings:                 The perianal and digital rectal  examinations were                            normal.                           Many small and large-mouthed diverticula were found                            in the sigmoid colon and descending colon.                           External hemorrhoids were found.                           The exam was otherwise without abnormality on                            direct and retroflexion views. Complications:            No immediate complications. Estimated Blood Loss:     Estimated blood loss was minimal. Impression:               - Diverticulosis in the sigmoid colon and in the                            descending colon.                           - External hemorrhoids.                           - The examination was otherwise normal on direct                            and retroflexion views.                           - No specimens  collected. Recommendation:           - Patient has a contact number available for                            emergencies. The signs and symptoms of potential                            delayed complications were discussed with the                            patient. Return to normal activities tomorrow.                            Written discharge instructions were provided to the                            patient.                           -  Resume previous diet.                           - Continue present medications.                           - No repeat colonoscopy due to current age (67                            years or older) and the absence of colonic polyps. Gatha Mayer, MD 02/21/2020 12:26:09 PM This report has been signed electronically.

## 2020-02-22 ENCOUNTER — Telehealth: Payer: Self-pay | Admitting: *Deleted

## 2020-02-22 NOTE — Telephone Encounter (Signed)
°  Follow up Call-  Call back number 02/21/2020  Post procedure Call Back phone  # [phone]  Permission to leave phone message Yes  Some recent data might be hidden     Patient questions:  Do you have a fever, pain , or abdominal swelling? No. Pain Score  0 *  Have you tolerated food without any problems? Yes.    Have you been able to return to your normal activities? Yes.    Do you have any questions about your discharge instructions: Diet   No. Medications  No. Follow up visit  No.  Do you have questions or concerns about your Care? No.  Actions: * If pain score is 4 or above: 1. No action needed, pain <4.Have you developed a fever since your procedure? no  2.   Have you had an respiratory symptoms (SOB or cough) since your procedure? no  3.   Have you tested positive for COVID 19 since your procedure no  4.   Have you had any family members/close contacts diagnosed with the COVID 19 since your procedure?  no   If yes to any of these questions please route to Laverna Peace, RN and Karlton Lemon, RN

## 2020-02-29 DIAGNOSIS — L820 Inflamed seborrheic keratosis: Secondary | ICD-10-CM | POA: Diagnosis not present

## 2020-03-17 DIAGNOSIS — H40023 Open angle with borderline findings, high risk, bilateral: Secondary | ICD-10-CM | POA: Diagnosis not present

## 2020-03-17 DIAGNOSIS — Z961 Presence of intraocular lens: Secondary | ICD-10-CM | POA: Diagnosis not present

## 2020-03-17 DIAGNOSIS — H532 Diplopia: Secondary | ICD-10-CM | POA: Diagnosis not present

## 2020-03-17 DIAGNOSIS — H5000 Unspecified esotropia: Secondary | ICD-10-CM | POA: Diagnosis not present

## 2020-03-30 DIAGNOSIS — Z940 Kidney transplant status: Secondary | ICD-10-CM | POA: Diagnosis not present

## 2020-04-04 DIAGNOSIS — R490 Dysphonia: Secondary | ICD-10-CM | POA: Diagnosis not present

## 2020-04-04 DIAGNOSIS — Z940 Kidney transplant status: Secondary | ICD-10-CM | POA: Diagnosis not present

## 2020-04-04 DIAGNOSIS — R634 Abnormal weight loss: Secondary | ICD-10-CM | POA: Diagnosis not present

## 2020-04-04 DIAGNOSIS — C4490 Unspecified malignant neoplasm of skin, unspecified: Secondary | ICD-10-CM | POA: Diagnosis not present

## 2020-04-04 DIAGNOSIS — E785 Hyperlipidemia, unspecified: Secondary | ICD-10-CM | POA: Diagnosis not present

## 2020-04-26 DIAGNOSIS — I10 Essential (primary) hypertension: Secondary | ICD-10-CM | POA: Diagnosis not present

## 2020-04-26 DIAGNOSIS — Z1159 Encounter for screening for other viral diseases: Secondary | ICD-10-CM | POA: Diagnosis not present

## 2020-05-01 DIAGNOSIS — Z0000 Encounter for general adult medical examination without abnormal findings: Secondary | ICD-10-CM | POA: Diagnosis not present

## 2020-05-01 DIAGNOSIS — Z1389 Encounter for screening for other disorder: Secondary | ICD-10-CM | POA: Diagnosis not present

## 2020-06-06 ENCOUNTER — Other Ambulatory Visit: Payer: Self-pay | Admitting: Internal Medicine

## 2020-06-06 NOTE — Telephone Encounter (Signed)
Please advise Sir, thank you. 

## 2020-06-29 DIAGNOSIS — I120 Hypertensive chronic kidney disease with stage 5 chronic kidney disease or end stage renal disease: Secondary | ICD-10-CM | POA: Diagnosis not present

## 2020-06-29 DIAGNOSIS — E1122 Type 2 diabetes mellitus with diabetic chronic kidney disease: Secondary | ICD-10-CM | POA: Diagnosis not present

## 2020-06-29 DIAGNOSIS — Z85828 Personal history of other malignant neoplasm of skin: Secondary | ICD-10-CM | POA: Diagnosis not present

## 2020-06-29 DIAGNOSIS — Z79899 Other long term (current) drug therapy: Secondary | ICD-10-CM | POA: Diagnosis not present

## 2020-06-29 DIAGNOSIS — I341 Nonrheumatic mitral (valve) prolapse: Secondary | ICD-10-CM | POA: Diagnosis not present

## 2020-06-29 DIAGNOSIS — N189 Chronic kidney disease, unspecified: Secondary | ICD-10-CM | POA: Diagnosis not present

## 2020-06-29 DIAGNOSIS — D631 Anemia in chronic kidney disease: Secondary | ICD-10-CM | POA: Diagnosis not present

## 2020-06-29 DIAGNOSIS — Z940 Kidney transplant status: Secondary | ICD-10-CM | POA: Diagnosis not present

## 2020-06-29 DIAGNOSIS — Z4822 Encounter for aftercare following kidney transplant: Secondary | ICD-10-CM | POA: Diagnosis not present

## 2020-06-29 DIAGNOSIS — Z5181 Encounter for therapeutic drug level monitoring: Secondary | ICD-10-CM | POA: Diagnosis not present

## 2020-06-29 DIAGNOSIS — Q612 Polycystic kidney, adult type: Secondary | ICD-10-CM | POA: Diagnosis not present

## 2020-06-29 DIAGNOSIS — D84821 Immunodeficiency due to drugs: Secondary | ICD-10-CM | POA: Diagnosis not present

## 2020-06-29 DIAGNOSIS — E559 Vitamin D deficiency, unspecified: Secondary | ICD-10-CM | POA: Diagnosis not present

## 2020-07-26 DIAGNOSIS — Z1231 Encounter for screening mammogram for malignant neoplasm of breast: Secondary | ICD-10-CM | POA: Diagnosis not present

## 2020-08-01 DIAGNOSIS — Z01419 Encounter for gynecological examination (general) (routine) without abnormal findings: Secondary | ICD-10-CM | POA: Diagnosis not present

## 2020-08-01 DIAGNOSIS — Z1272 Encounter for screening for malignant neoplasm of vagina: Secondary | ICD-10-CM | POA: Diagnosis not present

## 2020-09-01 ENCOUNTER — Ambulatory Visit
Admission: RE | Admit: 2020-09-01 | Discharge: 2020-09-01 | Disposition: A | Discharge status: Home / Self Care | Payer: Medicare PPO | Source: Ambulatory Visit | Attending: Family Medicine | Admitting: Family Medicine

## 2020-09-01 ENCOUNTER — Other Ambulatory Visit: Payer: Self-pay | Admitting: Family Medicine

## 2020-09-01 DIAGNOSIS — R0602 Shortness of breath: Secondary | ICD-10-CM | POA: Diagnosis not present

## 2020-09-01 DIAGNOSIS — Z681 Body mass index (BMI) 19 or less, adult: Secondary | ICD-10-CM | POA: Diagnosis not present

## 2020-09-01 DIAGNOSIS — R490 Dysphonia: Secondary | ICD-10-CM | POA: Diagnosis not present

## 2020-09-01 DIAGNOSIS — R5383 Other fatigue: Secondary | ICD-10-CM | POA: Diagnosis not present

## 2020-09-01 DIAGNOSIS — R059 Cough, unspecified: Secondary | ICD-10-CM | POA: Diagnosis not present

## 2020-09-05 DIAGNOSIS — L821 Other seborrheic keratosis: Secondary | ICD-10-CM | POA: Diagnosis not present

## 2020-09-05 DIAGNOSIS — L814 Other melanin hyperpigmentation: Secondary | ICD-10-CM | POA: Diagnosis not present

## 2020-09-05 DIAGNOSIS — Z85828 Personal history of other malignant neoplasm of skin: Secondary | ICD-10-CM | POA: Diagnosis not present

## 2020-09-05 DIAGNOSIS — D225 Melanocytic nevi of trunk: Secondary | ICD-10-CM | POA: Diagnosis not present

## 2020-09-05 DIAGNOSIS — L851 Acquired keratosis [keratoderma] palmaris et plantaris: Secondary | ICD-10-CM | POA: Diagnosis not present

## 2020-09-05 DIAGNOSIS — L820 Inflamed seborrheic keratosis: Secondary | ICD-10-CM | POA: Diagnosis not present

## 2020-09-05 DIAGNOSIS — D0461 Carcinoma in situ of skin of right upper limb, including shoulder: Secondary | ICD-10-CM | POA: Diagnosis not present

## 2020-09-05 DIAGNOSIS — L578 Other skin changes due to chronic exposure to nonionizing radiation: Secondary | ICD-10-CM | POA: Diagnosis not present

## 2020-09-05 DIAGNOSIS — L570 Actinic keratosis: Secondary | ICD-10-CM | POA: Diagnosis not present

## 2020-09-05 DIAGNOSIS — D045 Carcinoma in situ of skin of trunk: Secondary | ICD-10-CM | POA: Diagnosis not present

## 2020-09-05 DIAGNOSIS — D485 Neoplasm of uncertain behavior of skin: Secondary | ICD-10-CM | POA: Diagnosis not present

## 2020-09-08 DIAGNOSIS — H40023 Open angle with borderline findings, high risk, bilateral: Secondary | ICD-10-CM | POA: Diagnosis not present

## 2020-09-08 DIAGNOSIS — H26493 Other secondary cataract, bilateral: Secondary | ICD-10-CM | POA: Diagnosis not present

## 2020-09-08 DIAGNOSIS — H04123 Dry eye syndrome of bilateral lacrimal glands: Secondary | ICD-10-CM | POA: Diagnosis not present

## 2020-09-18 ENCOUNTER — Other Ambulatory Visit: Payer: Self-pay | Admitting: Family Medicine

## 2020-09-18 DIAGNOSIS — R059 Cough, unspecified: Secondary | ICD-10-CM

## 2020-09-18 DIAGNOSIS — R634 Abnormal weight loss: Secondary | ICD-10-CM

## 2020-09-20 ENCOUNTER — Other Ambulatory Visit: Payer: Self-pay | Admitting: Family Medicine

## 2020-09-20 ENCOUNTER — Telehealth: Payer: Self-pay

## 2020-09-20 DIAGNOSIS — R634 Abnormal weight loss: Secondary | ICD-10-CM

## 2020-09-20 DIAGNOSIS — R059 Cough, unspecified: Secondary | ICD-10-CM

## 2020-09-20 MED ORDER — DIPHENHYDRAMINE HCL 50 MG PO TABS: 50.0000 mg | ORAL_TABLET | Freq: Once | ORAL | 0 refills | Status: DC

## 2020-09-20 MED ORDER — PREDNISONE 50 MG PO TABS: ORAL_TABLET | ORAL | 0 refills | Status: DC

## 2020-09-20 NOTE — Telephone Encounter (Signed)
Phone call to patient to review instructions for 13 hr prep for CT w/ contrast on 10/09/20 at 1240PM. Prescription called into [facility]. Pt aware and verbalized understanding of instructions.  Prescription: Pt to take 50 mg of prednisone on 10/08/20 at 1140PM, 50 mg of prednisone on 10/09/20 at 0540AM, and 50 mg of prednisone on 10/09/20 at 1140AM. Pt is also to take 50 mg of benadryl on 10/09/20 at 1140AM. Please call [phone] with any questions

## 2020-10-09 ENCOUNTER — Inpatient Hospital Stay: Admission: RE | Admit: 2020-10-09 | Payer: Medicare PPO | Source: Ambulatory Visit

## 2020-10-09 ENCOUNTER — Other Ambulatory Visit: Payer: Self-pay

## 2020-10-09 ENCOUNTER — Ambulatory Visit
Admission: RE | Admit: 2020-10-09 | Discharge: 2020-10-09 | Disposition: A | Discharge status: Home / Self Care | Payer: Medicare PPO | Source: Ambulatory Visit | Attending: Family Medicine | Admitting: Family Medicine

## 2020-10-09 DIAGNOSIS — R059 Cough, unspecified: Secondary | ICD-10-CM | POA: Diagnosis not present

## 2020-10-09 DIAGNOSIS — R634 Abnormal weight loss: Secondary | ICD-10-CM | POA: Diagnosis not present

## 2020-10-09 DIAGNOSIS — I700 Atherosclerosis of aorta: Secondary | ICD-10-CM | POA: Diagnosis not present

## 2020-10-09 DIAGNOSIS — R0602 Shortness of breath: Secondary | ICD-10-CM | POA: Diagnosis not present

## 2020-10-09 DIAGNOSIS — N200 Calculus of kidney: Secondary | ICD-10-CM | POA: Diagnosis not present

## 2020-10-09 MED ORDER — IOPAMIDOL (ISOVUE-300) INJECTION 61%
80.0000 mL | Freq: Once | INTRAVENOUS | Status: AC | PRN
  Administered 2020-10-09: 80 mL via INTRAVENOUS

## 2020-10-18 DIAGNOSIS — Z940 Kidney transplant status: Secondary | ICD-10-CM | POA: Diagnosis not present

## 2020-10-23 ENCOUNTER — Ambulatory Visit: Payer: Medicare PPO | Admitting: Physician Assistant

## 2020-10-23 ENCOUNTER — Encounter: Payer: Self-pay | Admitting: Physician Assistant

## 2020-10-23 VITALS — BP 110/60 | HR 75 | Ht 62.0 in | Wt 107.1 lb

## 2020-10-23 DIAGNOSIS — R143 Flatulence: Secondary | ICD-10-CM

## 2020-10-23 DIAGNOSIS — E785 Hyperlipidemia, unspecified: Secondary | ICD-10-CM | POA: Diagnosis not present

## 2020-10-23 DIAGNOSIS — R490 Dysphonia: Secondary | ICD-10-CM | POA: Diagnosis not present

## 2020-10-23 DIAGNOSIS — R140 Abdominal distension (gaseous): Secondary | ICD-10-CM | POA: Diagnosis not present

## 2020-10-23 DIAGNOSIS — K30 Functional dyspepsia: Secondary | ICD-10-CM

## 2020-10-23 DIAGNOSIS — R197 Diarrhea, unspecified: Secondary | ICD-10-CM

## 2020-10-23 DIAGNOSIS — Z940 Kidney transplant status: Secondary | ICD-10-CM | POA: Diagnosis not present

## 2020-10-23 DIAGNOSIS — D72829 Elevated white blood cell count, unspecified: Secondary | ICD-10-CM | POA: Diagnosis not present

## 2020-10-23 DIAGNOSIS — C4490 Unspecified malignant neoplasm of skin, unspecified: Secondary | ICD-10-CM | POA: Diagnosis not present

## 2020-10-23 DIAGNOSIS — R634 Abnormal weight loss: Secondary | ICD-10-CM | POA: Diagnosis not present

## 2020-10-23 MED ORDER — GLYCOPYRROLATE 2 MG PO TABS: ORAL_TABLET | ORAL | 3 refills | Status: AC

## 2020-10-23 MED ORDER — BUSPIRONE HCL 10 MG PO TABS: 10.0000 mg | ORAL_TABLET | Freq: Two times a day (BID) | ORAL | 3 refills | Status: DC

## 2020-10-23 NOTE — Progress Notes (Signed)
Subjective:    Patient ID: Pamela Mcknight, female    DOB: 1945-12-01, 75 y.o.   MRN: [id number]  HPI Pamela Mcknight is a pleasant 75 year old white female established with Dr. Carlean Purl, and also known to myself.  She comes in today with complaints of constant abdominal bloating and gas which has been worse postprandially over the past 3 months.  This has been associated with diarrhea generally having 2 bowel movements per day, which have been nonbloody.  She says she was also having significant fatigue in June, she is now using 1 Neurontin at nighttime which she says helps her rest and this is improved her fatigue. 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 She had not been recently placed on any new medications or antibiotics.  She says that for 5 months ago she had lost about 8 pounds but has now been stable over the past month but has not been able to regain.  She says she eats smaller amounts, and frequently if she eats breakfast and lunch she does not have any appetite for dinner. She says she generally feels okay in the morning until after she eats and then symptoms will be persistent the rest of the day. She does have history of polycystic kidney disease and is status post renal transplant, maintained on Prograf, history of diverticulosis, colon polyps, adult onset diabetes mellitus and hypertension. She had also undergone work-up for persistent hoarseness and dysphagia in 2021.  She had EGD in October 2018 which was normal pH study and manometry in 2021 with no evidence for significant reflux and manometry did show some hyper contractility of the esophagus. Last colonoscopy December 2021 with multiple left colon diverticuli no polyps, external hemorrhoids.  Review of Systems. Pertinent positive and negative review of systems were noted in the above HPI section.  All other review of systems was otherwise negative.    Outpatient Encounter Medications as of 10/23/2020  Medication Sig   GABAPENTIN PO Take 1 mg by mouth at bedtime.   glycopyrrolate (ROBINUL) 2 MG tablet Take 1 tablet twice daily 30 minutes prior to meals as needed for bloating, gas, and /or diarrhea   busPIRone (BUSPAR) 10 MG tablet Take 1 tablet (10 mg total) by mouth 2 (two) times daily.   Cholecalciferol (VITAMIN D3) 2000 UNITS TABS Take 1 tablet by mouth daily.   estradiol (ESTRACE) 1 MG tablet Take 1 mg by mouth daily.   fish oil-omega-3 fatty acids 1000 MG capsule Take 1 g by mouth daily.   metoprolol tartrate (LOPRESSOR) 25 MG tablet Take 25 mg by mouth 2 (two) times daily.   mycophenolate (MYFORTIC) 180 MG EC tablet Take 180-360 mg by mouth 2 (two) times daily. Take 1 tablet (180 mg) in the am and Take 2 tablets (360 mg) in the evening   Probiotic Product (ALIGN PO) Take 1 capsule by mouth daily.    PROGRAF 0.5 MG capsule Take 1-1.5 mg by mouth 2 (two) times daily. Take 2 tablets (1 mg) in the am and Take 3 tablets (1.5 mg) in the evening   [DISCONTINUED] busPIRone (BUSPAR) 10 MG tablet TAKE 1 TABLET BY MOUTH TWICE A DAY   [DISCONTINUED] dapsone 25 MG tablet Take 50 mg by mouth daily. 2 by mouth in the am   [DISCONTINUED] diphenhydrAMINE (BENADRYL) 50 MG tablet Take 1 tablet (50 mg total) by mouth once for 1 dose. Pt is to  take 50 mg of benadryl on 10/09/20 at 1140AM. Please call [phone] with any questions   [DISCONTINUED] predniSONE (DELTASONE) 50 MG tablet Pt to take 50 mg of prednisone on 10/08/20 at 1140PM, 50 mg of prednisone on 10/09/20 at 0540AM, and 50 mg of prednisone on 10/09/20 at 1140AM. Pt is also to take 50 mg of benadryl on 10/09/20 at 1140AM. Please call [phone] with any questions.   No facility-administered encounter medications on file as of 10/23/2020.   Allergies  Allergen Reactions   Contrast Media [Iodinated Diagnostic Agents] Rash   Percocet [Oxycodone-Acetaminophen] Rash   Sulfonamide Derivatives      angioedema   Sulfamethoxazole Swelling    Mouth swells   Iodine-131 Rash   Oxycodone-Acetaminophen Rash   Patient Active Problem List   Diagnosis Date Noted   History of kidney transplant 04/27/2019   Cough    Atypical chest pain    Esophageal dysphagia    Neck pain on left side 02/24/2019   Left facial pain 02/24/2019   Functional dyspepsia 01/29/2017   Menopausal symptom 12/02/2016   Encounter for aftercare following kidney transplant 06/05/2016   Encounter for monitoring tacrolimus therapy 06/05/2016   Squamous cell skin cancer 06/06/2013   Transplant recipient 06/24/2011   Diabetes mellitus ([place]) Jun 16, 2011   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   Immunosuppressive management encounter following kidney transplant 05/17/2011   Kidney replaced by transplant 05/14/2011   Anemia of renal disease 01/10/2011   Diverticulosis of intestine 01/10/2011   Mitral valve prolapse 01/10/2011   RENAL INSUFFICIENCY, CHRONIC 08/17/2009   HYPERLIPIDEMIA 07/04/2008   Hypertension goal BP (blood pressure) < 130/80 07/04/2008   COLONIC POLYPS, HX OF 07/04/2008   POLYCYSTIC KIDNEY DISEASE 07/22/2007   DIVERTICULITIS, HX OF 07/22/2007   Social History   Socioeconomic History   Marital status: Single    Spouse name: Not on file   Number of children: 0   Years of education: Not on file   Highest education level: Not on file  Occupational History   Occupation: retired  Tobacco Use   Smoking status: Former    Packs/day: 1.00    Years: 10.00    Pack years: 10.00    Types: Cigarettes    Quit date: 02/25/1978    Years since quitting: 42.6   Smokeless tobacco: Never   Tobacco comments:    30 years ago as of 2012  Vaping Use   Vaping Use: Never used  Substance and Sexual Activity   Alcohol use: Not Currently    Comment: rarely   Drug use: No   Sexual activity: Not on file  Other Topics Concern   Not on file  Social History Narrative   The patient is single, she  drinks some alcohol, she is a former smoker    Social Determinants of Radio broadcast assistant Strain: Not on file  Food Insecurity: Not on file  Transportation Needs: Not on file  Physical Activity: Not on file  Stress: Not on file  Social Connections: Not on file  Intimate Partner Violence: Not on file    Ms. Buckalew's family history includes Glaucoma in her mother; Heart attack in her maternal aunt and maternal uncle; Hyperlipidemia in her mother; Hypertension in her brother; Macular degeneration in her mother; Osteoarthritis in her mother; Other (age of onset: 58) in her mother; Polycystic kidney disease in her father, paternal grandmother, paternal uncle, and sister; Stroke (age of onset: 64) in her father.      Objective:  Vitals:   10/23/20 1508  BP: 110/60  Pulse: 75    Physical Exam.Well-developed well-nourished elderly white female in no acute distress.  Height, Weight, 107 BMI 19.5  HEENT; nontraumatic normocephalic, EOMI, PE R LA, sclera anicteric. Oropharynx; not examined today Neck; supple, no JVD Cardiovascular; regular rate and rhythm with S1-S2, no murmur rub or gallop Pulmonary; Clear bilaterally Abdomen; soft, nontender, nondistended, no palpable mass or hepatosplenomegaly, bowel sounds are somewhat hyperactive Rectal; not done today Skin; benign exam, no jaundice rash or appreciable lesions Extremities; no clubbing cyanosis or edema skin warm and dry Neuro/Psych; alert and oriented x4, grossly nonfocal mood and affect appropriate        Assessment & Plan:   #43 75 year old white female with 75-monthhistory of persistent abdominal bloating and gassiness associated with loose stools generally twice daily postprandially.  Symptoms exacerbated after eating regardless of intake.  History of functional dyspepsia, Suspect she may have small intestinal bacterial overgrowth/IBS D component  #2 colon cancer screening-up-to-date with negative colonoscopy  December 2021/no polyps, left-sided diverticulosis #3 chronic intermittent hoarseness and dysphagia, negative work-up 2021 as outlined above #4 status post renal transplant-immunosuppression with Prograf #5 adult onset diabetes mellitus Plan; Schedule for breath testing to rule out SIBO, treat with Xifaxan if positive In the interim start trial of glycopyrrolate 2 mg p.o. daily to twice daily AC Continue BuSpar 10 mg twice daily Further recommendations pending results of breath testing   AAlfredia FergusonPA-C 10/23/2020   Cc: MKathyrn Lass MD

## 2020-10-23 NOTE — Patient Instructions (Addendum)
If you are age 73 or older, your body mass index should be between 23-30. Your Body mass index is 19.59 kg/m. If this is out of the aforementioned range listed, please consider follow up with your Primary Care Provider. __________________________________________________________  The [facility] providers would like to encourage you to use [facility] to communicate with providers for non-urgent requests or questions.  Due to long hold times on the telephone, sending your provider a message by [facility] may be a faster and more efficient way to get a response.  Please allow 48 business hours for a response.  Please remember that this is for non-urgent requests.   You have been given a testing kit to check for small intestine bacterial overgrowth (SIBO) which is completed by a company named [facility]. Make sure to return your test in the mail using the return mailing label given to you along with the kit. Your demographic and insurance information have already been sent to the company and they should be in contact with you over the next week regarding this test. [facility] will collect an upfront charge of $99.74 for commercial insurance plans and $209.74 is you are paying cash. Make sure to discuss with [facility] PRIOR to having the test if they have gotten informatoin from your insurance company as to how much your testing will cost out of pocket, if any. Please keep in mind that you will be getting a call from phone number [phone] or a similar number. If you do not hear from them within this time frame, please call our office at [phone].   Continue Buspar 10 mg   Start Glycopyrrolate 2 mg 1 tablet twice daily 30 minutes before meals as needed for bloating, gas, or diarrhea.  Follow up pending the results of your SIBO test or as needed.  Thank you for entrusting me with your care and choosing [facility].  Amy Esterwood, PA-C

## 2020-10-24 DIAGNOSIS — Z940 Kidney transplant status: Secondary | ICD-10-CM | POA: Diagnosis not present

## 2020-11-14 ENCOUNTER — Telehealth: Payer: Self-pay | Admitting: Physician Assistant

## 2020-11-14 NOTE — Telephone Encounter (Signed)
Inbound call from patient stating she still has not received a call in regards to SIBO test.  Please advise.

## 2020-11-15 NOTE — Telephone Encounter (Signed)
Tried contacting [facility] to see how far out they are with contacting patients. Unable to speak with anyone at this time will call back. [phone]

## 2020-11-15 NOTE — Telephone Encounter (Signed)
Called and spoke with a representative from [employer]. He let me know that they did not have the patient on file to contact. I have resent the patient's paperwork.

## 2020-11-16 NOTE — Telephone Encounter (Signed)
Called patient and advised her that I had contacted the company about when they would be calling her. I advised that I had to re-fax her paper work to them so they should get back to her soon. I have also given her their phone number so she would be able to contact the company as well.   While on the phone with the patient she glet me know that she has not been able to take the glycopyrrolate because it is giving her bad dry mouth. Please advise on something else that can be sent in.

## 2020-11-17 NOTE — Telephone Encounter (Signed)
Spoke with patient advised that she could try Biotene to help with her dry mouth if she were to continue with glycopyrrolate. She stated that since she already suffers from dry mouth this seems to exacerbate it and she would not be able to continue with it. I advised that since that is the case she could use Gas-x 1-2 before meals. She plans to try that.

## 2020-11-27 DIAGNOSIS — Z940 Kidney transplant status: Secondary | ICD-10-CM | POA: Diagnosis not present

## 2020-12-04 DIAGNOSIS — R197 Diarrhea, unspecified: Secondary | ICD-10-CM | POA: Diagnosis not present

## 2020-12-04 DIAGNOSIS — K30 Functional dyspepsia: Secondary | ICD-10-CM | POA: Diagnosis not present

## 2020-12-04 DIAGNOSIS — R140 Abdominal distension (gaseous): Secondary | ICD-10-CM | POA: Diagnosis not present

## 2020-12-04 DIAGNOSIS — R143 Flatulence: Secondary | ICD-10-CM | POA: Diagnosis not present

## 2020-12-07 ENCOUNTER — Telehealth: Payer: Self-pay | Admitting: Physician Assistant

## 2020-12-07 NOTE — Telephone Encounter (Signed)
Order had ICD-10 cades and signature, re-faxed order. Tried to call and reach Ronny Flurry, but all customer service was busy. Will try to call again later.

## 2020-12-07 NOTE — Telephone Encounter (Signed)
Inbound call from Ronny Flurry with [employer] stating the y are missing ICD10 code and clinician signature for the SIBO test.

## 2020-12-11 NOTE — Telephone Encounter (Signed)
Results have been sent the office and given to [employer]

## 2020-12-14 ENCOUNTER — Telehealth: Payer: Self-pay

## 2020-12-14 NOTE — Telephone Encounter (Signed)
Patient is advised of the negative SIBO test.

## 2020-12-14 NOTE — Telephone Encounter (Signed)
-----   Message from Alfredia Ferguson, PA-C sent at 12/13/2020  3:18 PM EDT ----- Regarding: SIBO breath test Please call pt and let her know the breath test is negative for Bacterial overgrowth.

## 2021-01-25 DIAGNOSIS — Z940 Kidney transplant status: Secondary | ICD-10-CM | POA: Diagnosis not present

## 2021-01-25 DIAGNOSIS — R739 Hyperglycemia, unspecified: Secondary | ICD-10-CM | POA: Diagnosis not present

## 2021-02-06 DIAGNOSIS — Z940 Kidney transplant status: Secondary | ICD-10-CM | POA: Diagnosis not present

## 2021-02-16 DIAGNOSIS — U071 COVID-19: Secondary | ICD-10-CM | POA: Diagnosis not present

## 2021-03-16 DIAGNOSIS — H532 Diplopia: Secondary | ICD-10-CM | POA: Diagnosis not present

## 2021-03-16 DIAGNOSIS — H04123 Dry eye syndrome of bilateral lacrimal glands: Secondary | ICD-10-CM | POA: Diagnosis not present

## 2021-03-16 DIAGNOSIS — Z961 Presence of intraocular lens: Secondary | ICD-10-CM | POA: Diagnosis not present

## 2021-03-16 DIAGNOSIS — H40023 Open angle with borderline findings, high risk, bilateral: Secondary | ICD-10-CM | POA: Diagnosis not present

## 2021-04-17 DIAGNOSIS — L570 Actinic keratosis: Secondary | ICD-10-CM | POA: Diagnosis not present

## 2021-04-17 DIAGNOSIS — D0461 Carcinoma in situ of skin of right upper limb, including shoulder: Secondary | ICD-10-CM | POA: Diagnosis not present

## 2021-04-17 DIAGNOSIS — Z23 Encounter for immunization: Secondary | ICD-10-CM | POA: Diagnosis not present

## 2021-04-20 ENCOUNTER — Ambulatory Visit: Payer: Medicare PPO | Admitting: Podiatry

## 2021-04-20 ENCOUNTER — Other Ambulatory Visit: Payer: Self-pay

## 2021-04-20 DIAGNOSIS — Q828 Other specified congenital malformations of skin: Secondary | ICD-10-CM | POA: Diagnosis not present

## 2021-04-26 NOTE — Progress Notes (Signed)
Subjective:  Patient ID: Pamela Mcknight, female    DOB: 07/20/1945,  MRN: [id number]  Chief Complaint  Patient presents with   Callouses    Left foot callus     76 y.o. female presents with the above complaint.  Patient presents with left submetatarsal 3 plantarflexed metatarsal with underlying porokeratotic lesion.  Patient states been there for few months has progressed gotten worse.  Sometimes it causes discomfort.  Hurts with ambulation.  She has not seen MRIs prior to seeing me.  Pain scale 7 out of 10 is sharp shooting in nature.   Review of Systems: Negative except as noted in the HPI. Denies N/V/F/Ch.  Past Medical History:  Diagnosis Date   Blood transfusion without reported diagnosis    Cancer ([place])    skin cancer   Colonic polyp    hyerplastic 2002; negative 2011, Dr Sharlett Iles   CRF (chronic renal failure)    Diverticulitis of colon 1991   Hyperlipidemia    Hypertension    Mitral valve disorder    Mitral Regurgitation on 2 D ECHO   Osteopenia    Polycystic kidney disease    Dr Erling Cruz; S/P transplant    Current Outpatient Medications:    busPIRone (BUSPAR) 10 MG tablet, Take 1 tablet (10 mg total) by mouth 2 (two) times daily., Disp: 180 tablet, Rfl: 3   Cholecalciferol (VITAMIN D3) 2000 UNITS TABS, Take 1 tablet by mouth daily., Disp: , Rfl:    estradiol (ESTRACE) 1 MG tablet, Take 1 mg by mouth daily., Disp: , Rfl:    fish oil-omega-3 fatty acids 1000 MG capsule, Take 1 g by mouth daily., Disp: , Rfl:    GABAPENTIN PO, Take 1 mg by mouth at bedtime., Disp: , Rfl:    glycopyrrolate (ROBINUL) 2 MG tablet, Take 1 tablet twice daily 30 minutes prior to meals as needed for bloating, gas, and /or diarrhea, Disp: 60 tablet, Rfl: 3   metoprolol tartrate (LOPRESSOR) 25 MG tablet, Take 25 mg by mouth 2 (two) times daily., Disp: , Rfl:    mycophenolate (MYFORTIC) 180 MG EC tablet, Take 180-360 mg by mouth 2 (two) times daily. Take 1 tablet (180 mg) in the am and Take  2 tablets (360 mg) in the evening, Disp: , Rfl:    Probiotic Product (ALIGN PO), Take 1 capsule by mouth daily. , Disp: , Rfl:    PROGRAF 0.5 MG capsule, Take 1-1.5 mg by mouth 2 (two) times daily. Take 2 tablets (1 mg) in the am and Take 3 tablets (1.5 mg) in the evening, Disp: , Rfl:   Social History   Tobacco Use  Smoking Status Former   Packs/day: 1.00   Years: 10.00   Pack years: 10.00   Types: Cigarettes   Quit date: 02/25/1978   Years since quitting: 43.1  Smokeless Tobacco Never  Tobacco Comments   30 years ago as of 2012    Allergies  Allergen Reactions   Contrast Media [Iodinated Contrast Media] Rash   Percocet [Oxycodone-Acetaminophen] Rash   Sulfonamide Derivatives     angioedema   Sulfamethoxazole Swelling    Mouth swells   Iodine-131 Rash   Oxycodone-Acetaminophen Rash   Objective:  There were no vitals filed for this visit. There is no height or weight on file to calculate BMI. Constitutional Well developed. Well nourished.  Vascular Dorsalis pedis pulses palpable bilaterally. Posterior tibial pulses palpable bilaterally. Capillary refill normal to all digits.  No cyanosis or clubbing noted. Pedal hair  growth normal.  Neurologic Normal speech. Oriented to person, place, and time. Epicritic sensation to light touch grossly present bilaterally.  Dermatologic Left third metatarsal plantarflexed metatarsal.  Pain on palpation to the third met.  Porokeratotic lesion noted with central nucleated core.  Mild pain on palpation  Orthopedic: Normal joint ROM without pain or crepitus bilaterally. No visible deformities. No bony tenderness.   Radiographs: None Assessment:   1. Porokeratosis    Plan:  Patient was evaluated and treated and all questions answered.  Left submetatarsal 3 porokeratotic lesion with underlying plantarflexed metatarsal -All questions and concerns were discussed with the patient in extensive detail.  Given the amount of pain that she  is having ambulation he will benefit from debridement of the lesion using chisel blade to handle the lesion was carried out in standard technique.  No complication noted. -If there is no improvement we will discuss surgical options during next time or offloading options.  No follow-ups on file.

## 2021-05-07 DIAGNOSIS — Z940 Kidney transplant status: Secondary | ICD-10-CM | POA: Diagnosis not present

## 2021-05-07 DIAGNOSIS — I700 Atherosclerosis of aorta: Secondary | ICD-10-CM | POA: Diagnosis not present

## 2021-05-07 DIAGNOSIS — Z0000 Encounter for general adult medical examination without abnormal findings: Secondary | ICD-10-CM | POA: Diagnosis not present

## 2021-05-07 DIAGNOSIS — Z1389 Encounter for screening for other disorder: Secondary | ICD-10-CM | POA: Diagnosis not present

## 2021-05-07 DIAGNOSIS — N951 Menopausal and female climacteric states: Secondary | ICD-10-CM | POA: Diagnosis not present

## 2021-05-07 DIAGNOSIS — I10 Essential (primary) hypertension: Secondary | ICD-10-CM | POA: Diagnosis not present

## 2021-05-08 DIAGNOSIS — Z940 Kidney transplant status: Secondary | ICD-10-CM | POA: Diagnosis not present

## 2021-05-16 DIAGNOSIS — E785 Hyperlipidemia, unspecified: Secondary | ICD-10-CM | POA: Diagnosis not present

## 2021-05-16 DIAGNOSIS — D72829 Elevated white blood cell count, unspecified: Secondary | ICD-10-CM | POA: Diagnosis not present

## 2021-05-16 DIAGNOSIS — R490 Dysphonia: Secondary | ICD-10-CM | POA: Diagnosis not present

## 2021-05-16 DIAGNOSIS — R634 Abnormal weight loss: Secondary | ICD-10-CM | POA: Diagnosis not present

## 2021-05-16 DIAGNOSIS — Z940 Kidney transplant status: Secondary | ICD-10-CM | POA: Diagnosis not present

## 2021-05-16 DIAGNOSIS — C4490 Unspecified malignant neoplasm of skin, unspecified: Secondary | ICD-10-CM | POA: Diagnosis not present

## 2021-06-19 DIAGNOSIS — L570 Actinic keratosis: Secondary | ICD-10-CM | POA: Diagnosis not present

## 2021-07-03 DIAGNOSIS — Z79621 Long term (current) use of calcineurin inhibitor: Secondary | ICD-10-CM | POA: Diagnosis not present

## 2021-07-03 DIAGNOSIS — Z87891 Personal history of nicotine dependence: Secondary | ICD-10-CM | POA: Diagnosis not present

## 2021-07-03 DIAGNOSIS — Z4822 Encounter for aftercare following kidney transplant: Secondary | ICD-10-CM | POA: Diagnosis not present

## 2021-07-03 DIAGNOSIS — Z940 Kidney transplant status: Secondary | ICD-10-CM | POA: Diagnosis not present

## 2021-07-03 DIAGNOSIS — I10 Essential (primary) hypertension: Secondary | ICD-10-CM | POA: Diagnosis not present

## 2021-07-03 DIAGNOSIS — D649 Anemia, unspecified: Secondary | ICD-10-CM | POA: Diagnosis not present

## 2021-07-03 DIAGNOSIS — E785 Hyperlipidemia, unspecified: Secondary | ICD-10-CM | POA: Diagnosis not present

## 2021-07-03 DIAGNOSIS — Z5181 Encounter for therapeutic drug level monitoring: Secondary | ICD-10-CM | POA: Diagnosis not present

## 2021-07-03 DIAGNOSIS — Z79899 Other long term (current) drug therapy: Secondary | ICD-10-CM | POA: Diagnosis not present

## 2021-07-03 DIAGNOSIS — Z7969 Long term (current) use of other immunomodulators and immunosuppressants: Secondary | ICD-10-CM | POA: Diagnosis not present

## 2021-07-03 DIAGNOSIS — Z792 Long term (current) use of antibiotics: Secondary | ICD-10-CM | POA: Diagnosis not present

## 2021-07-03 DIAGNOSIS — Z131 Encounter for screening for diabetes mellitus: Secondary | ICD-10-CM | POA: Diagnosis not present

## 2021-07-03 DIAGNOSIS — Z85828 Personal history of other malignant neoplasm of skin: Secondary | ICD-10-CM | POA: Diagnosis not present

## 2021-07-03 DIAGNOSIS — D8989 Other specified disorders involving the immune mechanism, not elsewhere classified: Secondary | ICD-10-CM | POA: Diagnosis not present

## 2021-07-07 IMAGING — RF DG ESOPHAGUS
10 of 12 series · 14 of 18 positions shown · non-contrast
Comparison: None.

CLINICAL DATA: Chronic cough

EXAM:
ESOPHOGRAM/BARIUM SWALLOW
TECHNIQUE: Combined double contrast and single contrast examination performed
using effervescent crystals, thick barium liquid, and thin barium
liquid.
FLUOROSCOPY TIME:  Fluoroscopy Time:  54 seconds

[Series 1: cp_standard · 0.51mm/px · 3 of 72 frames shown (1 of 10)]
[frame 11/72]
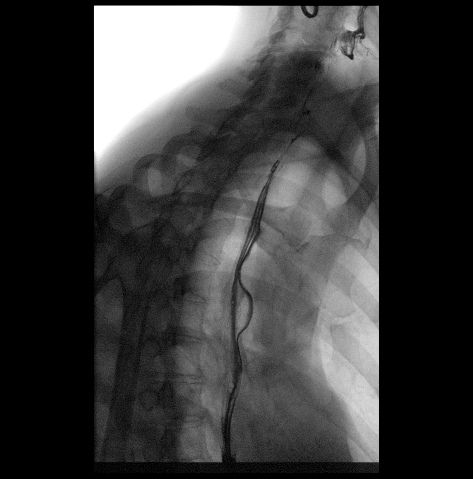
[frame 32/72]
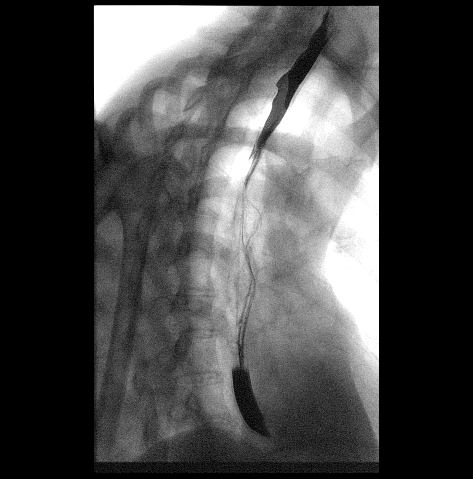
[frame 62/72]
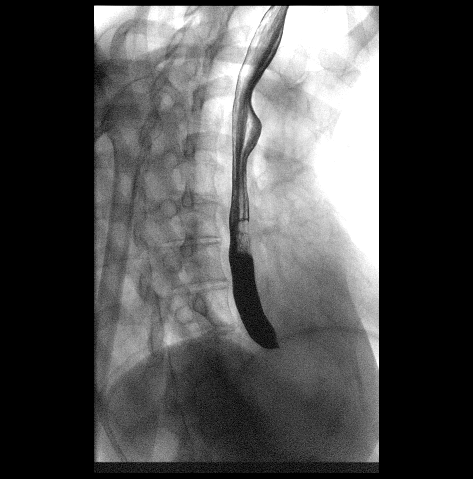

[Series 2: cp_standard · 0.51mm/px · 3 of 93 frames shown (2 of 10)]
[frame 14/93]
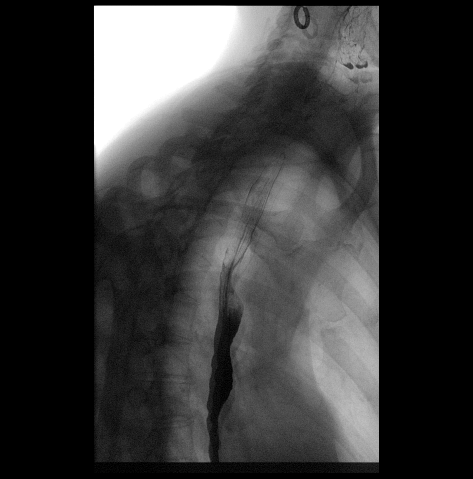
[frame 47/93]
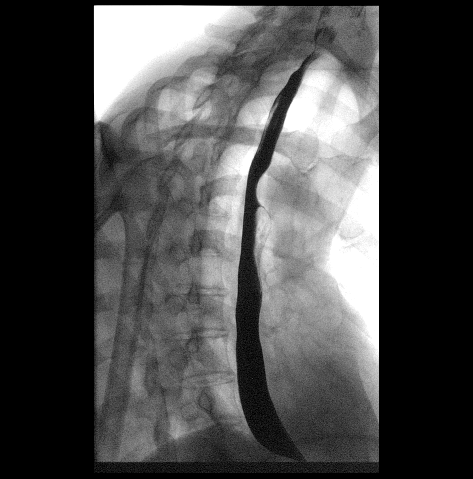
[frame 80/93]
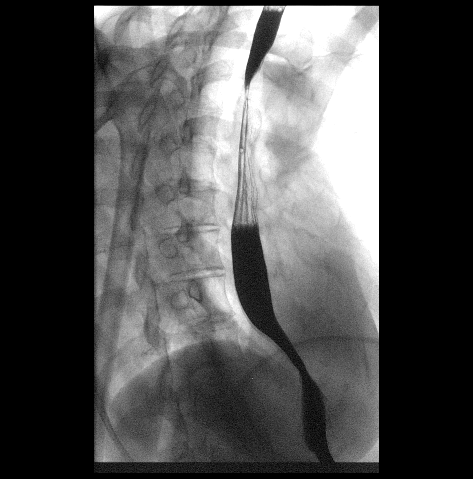

[Series 3: cp_standard · 0.25mm/px · 1 of 1 slices shown (3 of 10)]
[im 1/1]
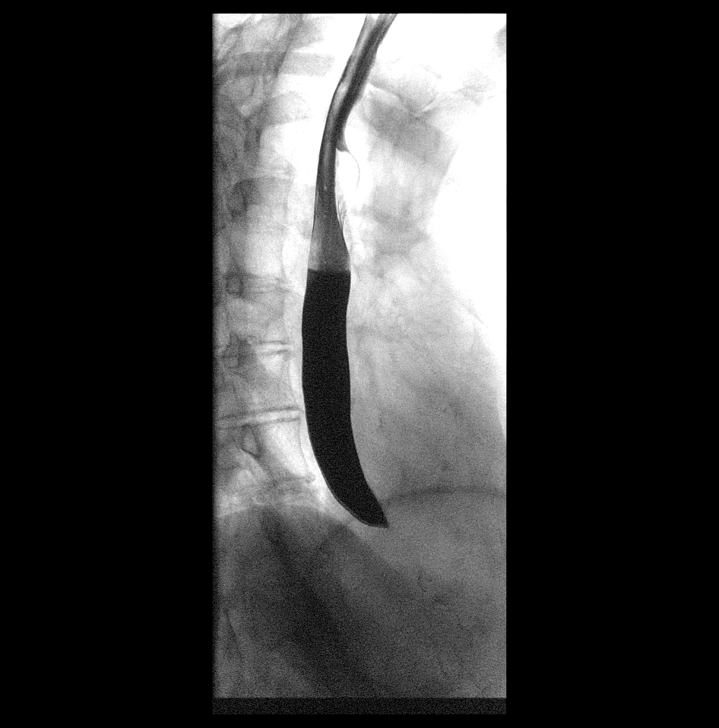

[Series 4: cp_standard · 0.25mm/px · 1 of 1 slices shown (4 of 10)]
[im 1/1]
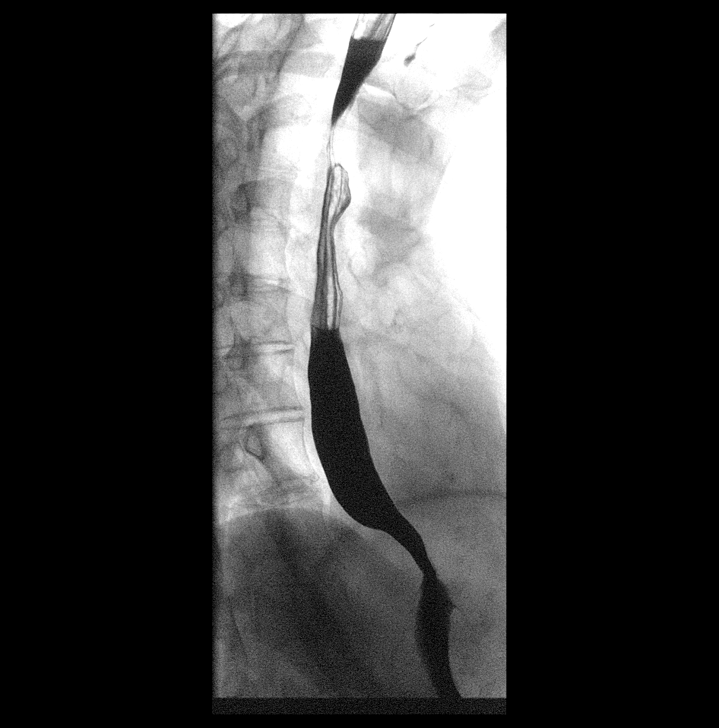

[Series 5: cp_standard · 0.25mm/px · 1 of 1 slices shown (5 of 10)]
[im 1/1]
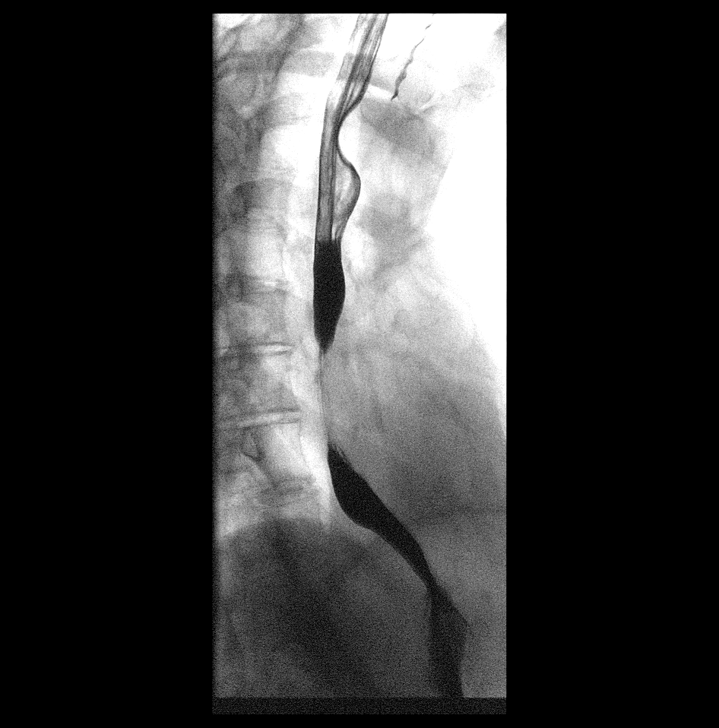

[Series 7: cp_standard · 0.25mm/px · 1 of 1 slices shown (6 of 10)]
[im 1/1]
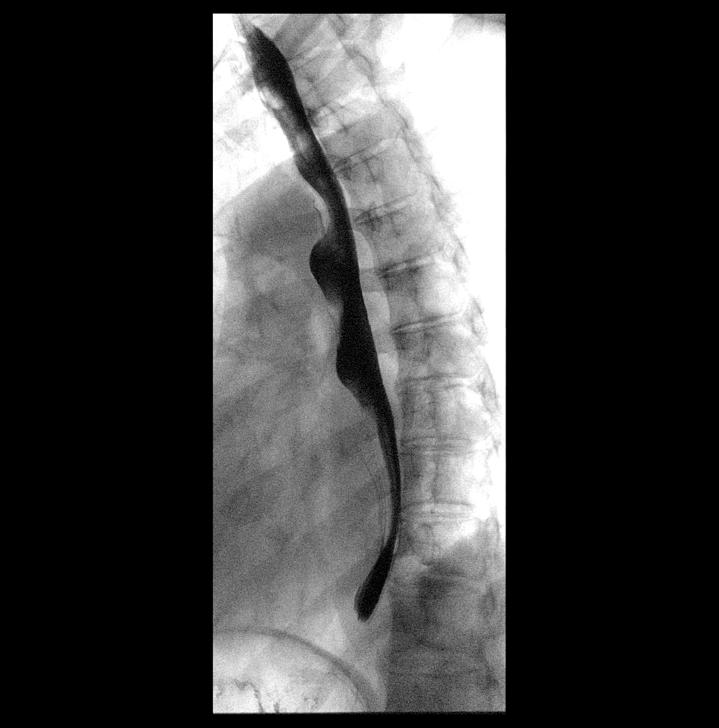

[Series 8: cp_standard · 0.25mm/px · 1 of 1 slices shown (7 of 10)]
[im 1/1]
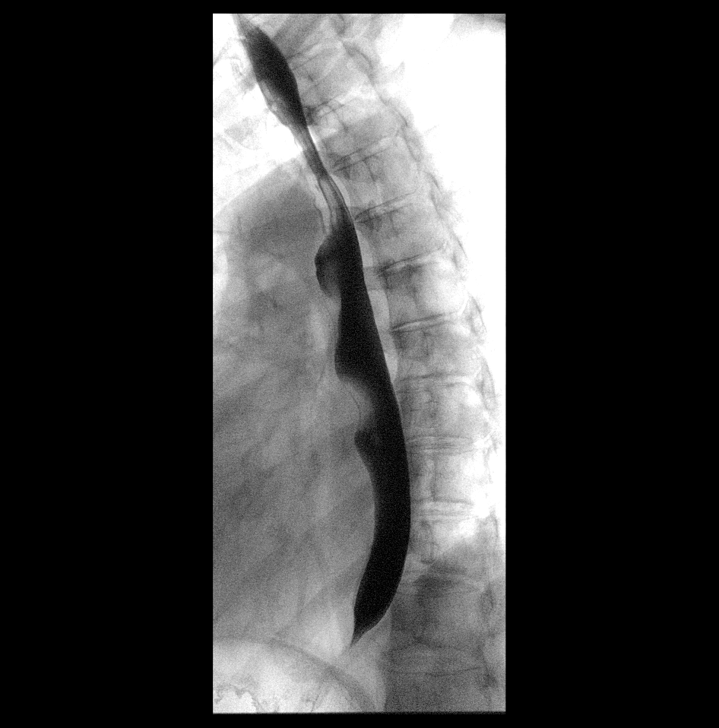

[Series 9: cp_standard · 0.25mm/px · 1 of 1 slices shown (8 of 10)]
[im 1/1]
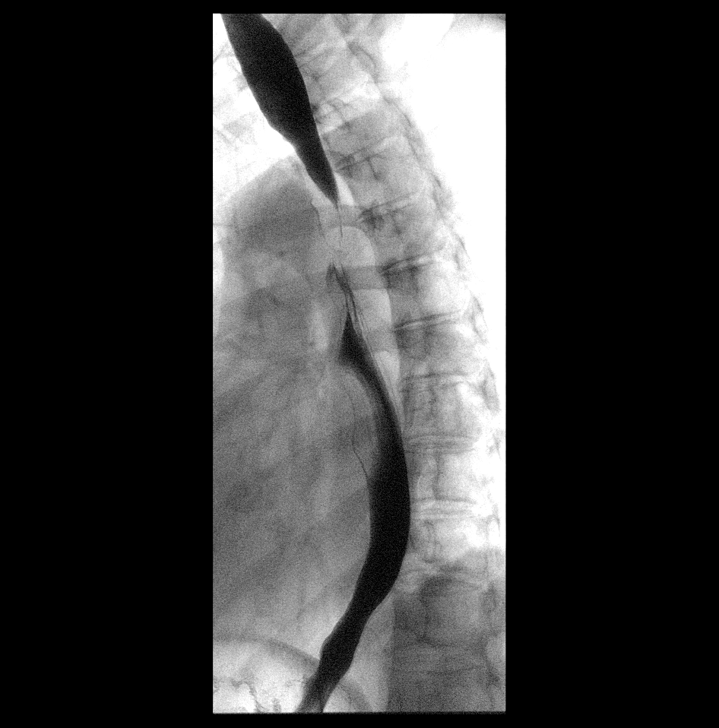

[Series 11: cp_standard · 0.26mm/px · 1 of 1 slices shown (9 of 10)]
[im 1/1]
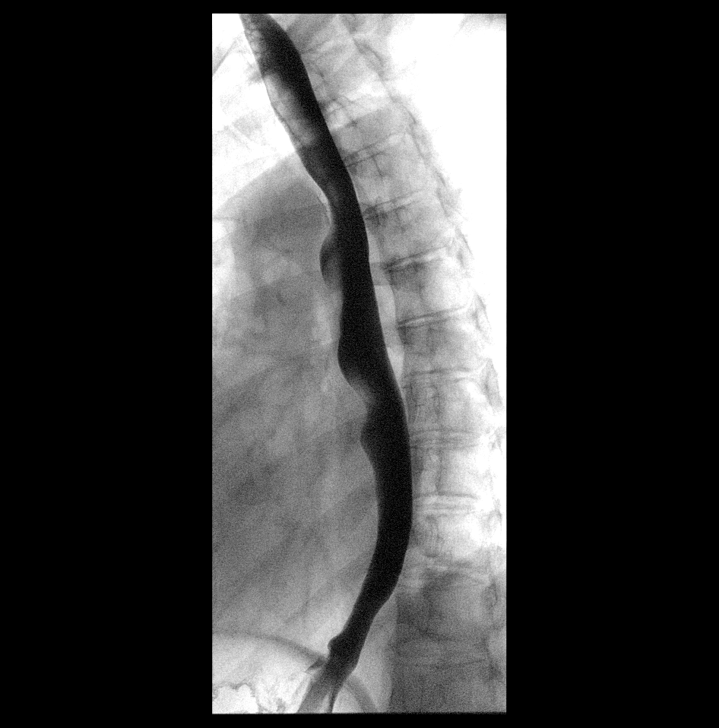

[Series 12: cp_standard · 0.25mm/px · 1 of 1 slices shown (10 of 10)]
[im 1/1]
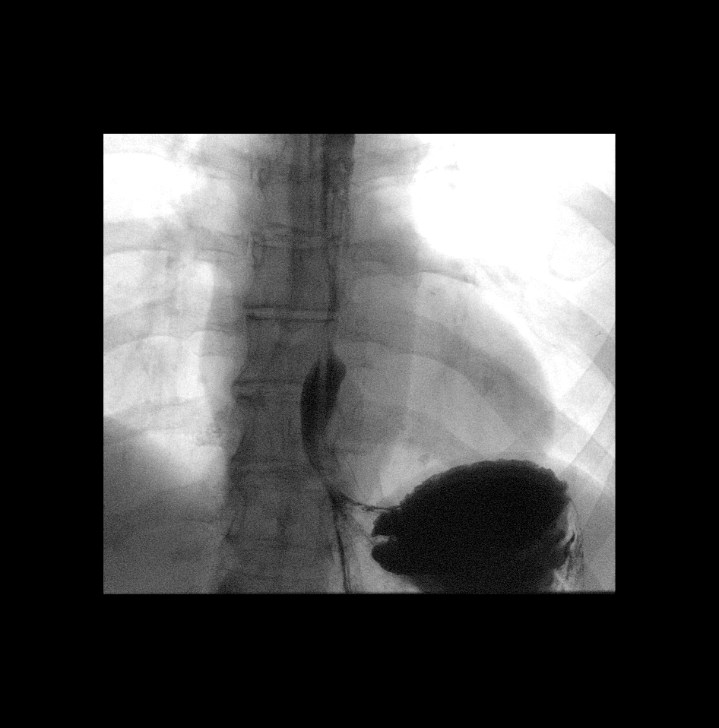

[14 of 18 positions shown; findings below may reference images not displayed]

FINDINGS: Patient swallowed barium without difficulty. There is no mass,
stricture, or other abnormality identified. Normal motility. No
hiatal hernia. There is spontaneous gastroesophageal reflux. Single
episode of aspiration with cough response.
IMPRESSION: No mass or stricture.

Spontaneous gastroesophageal reflux.

Single episode of aspiration with cough response.

## 2021-07-18 DIAGNOSIS — L570 Actinic keratosis: Secondary | ICD-10-CM | POA: Diagnosis not present

## 2021-08-01 DIAGNOSIS — Z1231 Encounter for screening mammogram for malignant neoplasm of breast: Secondary | ICD-10-CM | POA: Diagnosis not present

## 2021-08-01 DIAGNOSIS — Z780 Asymptomatic menopausal state: Secondary | ICD-10-CM | POA: Diagnosis not present

## 2021-08-15 DIAGNOSIS — Z940 Kidney transplant status: Secondary | ICD-10-CM | POA: Diagnosis not present

## 2021-09-17 DIAGNOSIS — L821 Other seborrheic keratosis: Secondary | ICD-10-CM | POA: Diagnosis not present

## 2021-09-17 DIAGNOSIS — L851 Acquired keratosis [keratoderma] palmaris et plantaris: Secondary | ICD-10-CM | POA: Diagnosis not present

## 2021-09-17 DIAGNOSIS — L820 Inflamed seborrheic keratosis: Secondary | ICD-10-CM | POA: Diagnosis not present

## 2021-09-17 DIAGNOSIS — D225 Melanocytic nevi of trunk: Secondary | ICD-10-CM | POA: Diagnosis not present

## 2021-09-17 DIAGNOSIS — L578 Other skin changes due to chronic exposure to nonionizing radiation: Secondary | ICD-10-CM | POA: Diagnosis not present

## 2021-09-17 DIAGNOSIS — L814 Other melanin hyperpigmentation: Secondary | ICD-10-CM | POA: Diagnosis not present

## 2021-09-17 DIAGNOSIS — Z85828 Personal history of other malignant neoplasm of skin: Secondary | ICD-10-CM | POA: Diagnosis not present

## 2021-09-17 DIAGNOSIS — L570 Actinic keratosis: Secondary | ICD-10-CM | POA: Diagnosis not present

## 2021-10-05 ENCOUNTER — Ambulatory Visit (INDEPENDENT_AMBULATORY_CARE_PROVIDER_SITE_OTHER): Payer: Medicare PPO

## 2021-10-05 ENCOUNTER — Ambulatory Visit: Payer: Medicare PPO | Admitting: Podiatry

## 2021-10-05 DIAGNOSIS — M216X2 Other acquired deformities of left foot: Secondary | ICD-10-CM

## 2021-10-05 DIAGNOSIS — Z01818 Encounter for other preprocedural examination: Secondary | ICD-10-CM | POA: Diagnosis not present

## 2021-10-05 DIAGNOSIS — Q828 Other specified congenital malformations of skin: Secondary | ICD-10-CM

## 2021-10-11 NOTE — Progress Notes (Signed)
Subjective:  Patient ID: Pamela Mcknight, female    DOB: 01/13/1946,  MRN: [id number]  Chief Complaint  Patient presents with   Callouses    76 y.o. female presents with the above complaint.  Patient presents for follow-up of left submetatarsal 3 porokeratosis with underlying plantarflexed metatarsal.  Patient states painful to touch is progressive gotten worse hurts with ambulation she would like to discuss surgical options at this time.  She is not a diabetic.  She denies any other acute complaints.   Review of Systems: Negative except as noted in the HPI. Denies N/V/F/Ch.  Past Medical History:  Diagnosis Date   Blood transfusion without reported diagnosis    Cancer ([place])    skin cancer   Colonic polyp    hyerplastic 2002; negative 2011, Dr Sharlett Iles   CRF (chronic renal failure)    Diverticulitis of colon 1991   Hyperlipidemia    Hypertension    Mitral valve disorder    Mitral Regurgitation on 2 D ECHO   Osteopenia    Polycystic kidney disease    Dr Erling Cruz; S/P transplant    Current Outpatient Medications:    busPIRone (BUSPAR) 10 MG tablet, Take 1 tablet (10 mg total) by mouth 2 (two) times daily., Disp: 180 tablet, Rfl: 3   Cholecalciferol (VITAMIN D3) 2000 UNITS TABS, Take 1 tablet by mouth daily., Disp: , Rfl:    estradiol (ESTRACE) 1 MG tablet, Take 1 mg by mouth daily., Disp: , Rfl:    fish oil-omega-3 fatty acids 1000 MG capsule, Take 1 g by mouth daily., Disp: , Rfl:    GABAPENTIN PO, Take 1 mg by mouth at bedtime., Disp: , Rfl:    glycopyrrolate (ROBINUL) 2 MG tablet, Take 1 tablet twice daily 30 minutes prior to meals as needed for bloating, gas, and /or diarrhea, Disp: 60 tablet, Rfl: 3   metoprolol tartrate (LOPRESSOR) 25 MG tablet, Take 25 mg by mouth 2 (two) times daily., Disp: , Rfl:    mycophenolate (MYFORTIC) 180 MG EC tablet, Take 180-360 mg by mouth 2 (two) times daily. Take 1 tablet (180 mg) in the am and Take 2 tablets (360 mg) in the evening,  Disp: , Rfl:    Probiotic Product (ALIGN PO), Take 1 capsule by mouth daily. , Disp: , Rfl:    PROGRAF 0.5 MG capsule, Take 1-1.5 mg by mouth 2 (two) times daily. Take 2 tablets (1 mg) in the am and Take 3 tablets (1.5 mg) in the evening, Disp: , Rfl:   Social History   Tobacco Use  Smoking Status Former   Packs/day: 1.00   Years: 10.00   Total pack years: 10.00   Types: Cigarettes   Quit date: 02/25/1978   Years since quitting: 43.6  Smokeless Tobacco Never  Tobacco Comments   30 years ago as of 2012    Allergies  Allergen Reactions   Contrast Media [Iodinated Contrast Media] Rash   Percocet [Oxycodone-Acetaminophen] Rash   Sulfonamide Derivatives     angioedema   Sulfamethoxazole Swelling    Mouth swells   Iodine-131 Rash   Oxycodone-Acetaminophen Rash   Objective:  There were no vitals filed for this visit. There is no height or weight on file to calculate BMI. Constitutional Well developed. Well nourished.  Vascular Dorsalis pedis pulses palpable bilaterally. Posterior tibial pulses palpable bilaterally. Capillary refill normal to all digits.  No cyanosis or clubbing noted. Pedal hair growth normal.  Neurologic Normal speech. Oriented to person, place, and time.  Epicritic sensation to light touch grossly present bilaterally.  Dermatologic Left third metatarsal plantarflexed metatarsal.  Pain on palpation to the third met.  Porokeratotic lesion noted with central nucleated core.  Mild pain on palpation  Orthopedic: Normal joint ROM without pain or crepitus bilaterally. No visible deformities. No bony tenderness.   Radiographs: 3 views of skeletally mature adult left foot: Plantarflexed third metatarsal noted.  No other bony abnormalities identified.  Plantar heel spur noted.  OsteoNeuritis of the first MPJ noted Assessment:   1. Plantar flexed metatarsal, left   2. Encounter for preoperative examination for general surgical procedure    Plan:  Patient was  evaluated and treated and all questions answered.  Left submetatarsal 3 porokeratotic lesion with underlying plantarflexed metatarsal -All questions and concerns were discussed with the patient in extensive detail.  Clinically she still continues to have recurrence of the porokeratosis painful to walk on.  At this time given that she has failed conservative treatment options she will benefit from surgical floating osteotomy of the third metatarsal.  I discussed my preoperative intra postop plan in extensive detail she states understanding and wants to proceed with surgery. -Informed surgical risk consent was reviewed and read aloud to the patient.  I reviewed the films.  I have discussed my findings with the patient in great detail.  I have discussed all risks including but not limited to infection, stiffness, scarring, limp, disability, deformity, damage to blood vessels and nerves, numbness, poor healing, need for braces, arthritis, chronic pain, amputation, death.  All benefits and realistic expectations discussed in great detail.  I have made no promises as to the outcome.  I have provided realistic expectations.  I have offered the patient a 2nd opinion, which they have declined and assured me they preferred to proceed despite the risks   No follow-ups on file.

## 2021-10-23 ENCOUNTER — Telehealth: Payer: Self-pay

## 2021-10-23 NOTE — Telephone Encounter (Signed)
DOS 11/19/2021  METATARSAL OSTEOTOMY 3RD LT - 28308  [facility]  [facility] - SEE BELOW  Voided Authorization #[id number]  Tracking #[id number]  Note: Administrative void. 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

## 2021-10-26 DIAGNOSIS — Z940 Kidney transplant status: Secondary | ICD-10-CM | POA: Diagnosis not present

## 2021-11-02 DIAGNOSIS — I10 Essential (primary) hypertension: Secondary | ICD-10-CM | POA: Diagnosis not present

## 2021-11-02 DIAGNOSIS — Z940 Kidney transplant status: Secondary | ICD-10-CM | POA: Diagnosis not present

## 2021-11-02 DIAGNOSIS — Z79899 Other long term (current) drug therapy: Secondary | ICD-10-CM | POA: Diagnosis not present

## 2021-11-02 DIAGNOSIS — D649 Anemia, unspecified: Secondary | ICD-10-CM | POA: Diagnosis not present

## 2021-11-19 ENCOUNTER — Other Ambulatory Visit: Payer: Self-pay | Admitting: Podiatry

## 2021-11-19 ENCOUNTER — Encounter: Payer: Self-pay | Admitting: Podiatry

## 2021-11-19 DIAGNOSIS — M7742 Metatarsalgia, left foot: Secondary | ICD-10-CM | POA: Diagnosis not present

## 2021-11-19 DIAGNOSIS — M21542 Acquired clubfoot, left foot: Secondary | ICD-10-CM | POA: Diagnosis not present

## 2021-11-19 DIAGNOSIS — M205X2 Other deformities of toe(s) (acquired), left foot: Secondary | ICD-10-CM | POA: Diagnosis not present

## 2021-11-19 MED ORDER — ACETAMINOPHEN 325 MG PO TABS: 650.0000 mg | ORAL_TABLET | Freq: Four times a day (QID) | ORAL | 2 refills | Status: AC | PRN

## 2021-11-24 ENCOUNTER — Other Ambulatory Visit: Payer: Self-pay | Admitting: Physician Assistant

## 2021-11-28 ENCOUNTER — Ambulatory Visit (INDEPENDENT_AMBULATORY_CARE_PROVIDER_SITE_OTHER): Payer: Medicare PPO | Admitting: Podiatry

## 2021-11-28 ENCOUNTER — Ambulatory Visit (INDEPENDENT_AMBULATORY_CARE_PROVIDER_SITE_OTHER): Payer: Medicare PPO

## 2021-11-28 DIAGNOSIS — M216X2 Other acquired deformities of left foot: Secondary | ICD-10-CM

## 2021-11-28 DIAGNOSIS — Z9889 Other specified postprocedural states: Secondary | ICD-10-CM

## 2021-11-28 NOTE — Progress Notes (Signed)
Subjective:  Patient ID: Pamela Mcknight, female    DOB: 03-10-1945,  MRN: [id number]  Chief Complaint  Patient presents with   Routine Post Op    POV #1 DOS 11/19/2021 LT 3RD FLOATING METATARSAL OSTEOTOMY    DOS: 11/19/2021 Procedure: Left third floating osteotomy  76 y.o. female returns for post-op check.  Patient states that she is doing well denies any other acute complaints very minimal pain.  No nausea fever chills vomiting  Review of Systems: Negative except as noted in the HPI. Denies N/V/F/Ch.  Past Medical History:  Diagnosis Date   Blood transfusion without reported diagnosis    Cancer ([place])    skin cancer   Colonic polyp    hyerplastic 2002; negative 2011, Dr Sharlett Iles   CRF (chronic renal failure)    Diverticulitis of colon 1991   Hyperlipidemia    Hypertension    Mitral valve disorder    Mitral Regurgitation on 2 D ECHO   Osteopenia    Polycystic kidney disease    Dr Erling Cruz; S/P transplant    Current Outpatient Medications:    acetaminophen (TYLENOL) 325 MG tablet, Take 2 tablets (650 mg total) by mouth every 6 (six) hours as needed., Disp: 30 tablet, Rfl: 2   busPIRone (BUSPAR) 10 MG tablet, TAKE 1 TABLET BY MOUTH TWICE A DAY, Disp: 180 tablet, Rfl: 0   Cholecalciferol (VITAMIN D3) 2000 UNITS TABS, Take 1 tablet by mouth daily., Disp: , Rfl:    estradiol (ESTRACE) 1 MG tablet, Take 1 mg by mouth daily., Disp: , Rfl:    fish oil-omega-3 fatty acids 1000 MG capsule, Take 1 g by mouth daily., Disp: , Rfl:    GABAPENTIN PO, Take 1 mg by mouth at bedtime., Disp: , Rfl:    glycopyrrolate (ROBINUL) 2 MG tablet, Take 1 tablet twice daily 30 minutes prior to meals as needed for bloating, gas, and /or diarrhea, Disp: 60 tablet, Rfl: 3   metoprolol tartrate (LOPRESSOR) 25 MG tablet, Take 25 mg by mouth 2 (two) times daily., Disp: , Rfl:    mycophenolate (MYFORTIC) 180 MG EC tablet, Take 180-360 mg by mouth 2 (two) times daily. Take 1 tablet (180 mg) in the am and  Take 2 tablets (360 mg) in the evening, Disp: , Rfl:    Probiotic Product (ALIGN PO), Take 1 capsule by mouth daily. , Disp: , Rfl:    PROGRAF 0.5 MG capsule, Take 1-1.5 mg by mouth 2 (two) times daily. Take 2 tablets (1 mg) in the am and Take 3 tablets (1.5 mg) in the evening, Disp: , Rfl:   Social History   Tobacco Use  Smoking Status Former   Packs/day: 1.00   Years: 10.00   Total pack years: 10.00   Types: Cigarettes   Quit date: 02/25/1978   Years since quitting: 43.8  Smokeless Tobacco Never  Tobacco Comments   30 years ago as of 2012    Allergies  Allergen Reactions   Contrast Media [Iodinated Contrast Media] Rash   Percocet [Oxycodone-Acetaminophen] Rash   Sulfonamide Derivatives     angioedema   Sulfamethoxazole Swelling    Mouth swells   Iodine-131 Rash   Oxycodone-Acetaminophen Rash   Objective:  There were no vitals filed for this visit. There is no height or weight on file to calculate BMI. Constitutional Well developed. Well nourished.  Vascular Foot warm and well perfused. Capillary refill normal to all digits.   Neurologic Normal speech. Oriented to person, place, and time.  Epicritic sensation to light touch grossly present bilaterally.  Dermatologic Skin healing well without signs of infection. Skin edges well coapted without signs of infection.  Orthopedic: Tenderness to palpation noted about the surgical site.   Radiographs: 3 views of skeletally mature adult left foot: Good correction alignment noted osteotomy across the metatarsal third noted. Assessment:   1. Plantar flexed metatarsal, left   2. Status post foot surgery    Plan:  Patient was evaluated and treated and all questions answered.  S/p foot surgery left -Progressing as expected post-operatively. -XR: See above -WB Status: Weightbearing as tolerated in surgical shoe -Sutures: Intact.  No clinical signs of dehiscence noted no complication noted. -Medications: None -Foot  redressed.  No follow-ups on file.

## 2021-12-12 ENCOUNTER — Ambulatory Visit (INDEPENDENT_AMBULATORY_CARE_PROVIDER_SITE_OTHER): Payer: Medicare PPO | Admitting: Podiatry

## 2021-12-12 DIAGNOSIS — M216X2 Other acquired deformities of left foot: Secondary | ICD-10-CM

## 2021-12-12 DIAGNOSIS — Z9889 Other specified postprocedural states: Secondary | ICD-10-CM

## 2021-12-12 NOTE — Progress Notes (Signed)
Subjective:  Patient ID: Pamela Mcknight, female    DOB: May 17, 1945,  MRN: [id number]  Chief Complaint  Patient presents with   Routine Post Op     POV #2 DOS 11/19/2021 LT 3RD FLOATING METATARSAL OSTEOTOMY    DOS: 11/19/2021 Procedure: Left third floating osteotomy  76 y.o. female returns for post-op check.  Patient states that she is doing well denies any other acute complaints very minimal pain.  No nausea fever chills vomiting  Review of Systems: Negative except as noted in the HPI. Denies N/V/F/Ch.  Past Medical History:  Diagnosis Date   Blood transfusion without reported diagnosis    Cancer ([place])    skin cancer   Colonic polyp    hyerplastic 2002; negative 2011, Dr Sharlett Iles   CRF (chronic renal failure)    Diverticulitis of colon 1991   Hyperlipidemia    Hypertension    Mitral valve disorder    Mitral Regurgitation on 2 D ECHO   Osteopenia    Polycystic kidney disease    Dr Erling Cruz; S/P transplant    Current Outpatient Medications:    acetaminophen (TYLENOL) 325 MG tablet, Take 2 tablets (650 mg total) by mouth every 6 (six) hours as needed., Disp: 30 tablet, Rfl: 2   busPIRone (BUSPAR) 10 MG tablet, TAKE 1 TABLET BY MOUTH TWICE A DAY, Disp: 180 tablet, Rfl: 0   Cholecalciferol (VITAMIN D3) 2000 UNITS TABS, Take 1 tablet by mouth daily., Disp: , Rfl:    estradiol (ESTRACE) 1 MG tablet, Take 1 mg by mouth daily., Disp: , Rfl:    fish oil-omega-3 fatty acids 1000 MG capsule, Take 1 g by mouth daily., Disp: , Rfl:    GABAPENTIN PO, Take 1 mg by mouth at bedtime., Disp: , Rfl:    glycopyrrolate (ROBINUL) 2 MG tablet, Take 1 tablet twice daily 30 minutes prior to meals as needed for bloating, gas, and /or diarrhea, Disp: 60 tablet, Rfl: 3   metoprolol tartrate (LOPRESSOR) 25 MG tablet, Take 25 mg by mouth 2 (two) times daily., Disp: , Rfl:    mycophenolate (MYFORTIC) 180 MG EC tablet, Take 180-360 mg by mouth 2 (two) times daily. Take 1 tablet (180 mg) in the am and  Take 2 tablets (360 mg) in the evening, Disp: , Rfl:    Probiotic Product (ALIGN PO), Take 1 capsule by mouth daily. , Disp: , Rfl:    PROGRAF 0.5 MG capsule, Take 1-1.5 mg by mouth 2 (two) times daily. Take 2 tablets (1 mg) in the am and Take 3 tablets (1.5 mg) in the evening, Disp: , Rfl:   Social History   Tobacco Use  Smoking Status Former   Packs/day: 1.00   Years: 10.00   Total pack years: 10.00   Types: Cigarettes   Quit date: 02/25/1978   Years since quitting: 43.8  Smokeless Tobacco Never  Tobacco Comments   30 years ago as of 2012    Allergies  Allergen Reactions   Contrast Media [Iodinated Contrast Media] Rash   Percocet [Oxycodone-Acetaminophen] Rash   Sulfonamide Derivatives     angioedema   Sulfamethoxazole Swelling    Mouth swells   Iodine-131 Rash   Oxycodone-Acetaminophen Rash   Objective:  There were no vitals filed for this visit. There is no height or weight on file to calculate BMI. Constitutional Well developed. Well nourished.  Vascular Foot warm and well perfused. Capillary refill normal to all digits.   Neurologic Normal speech. Oriented to person, place, and  time. Epicritic sensation to light touch grossly present bilaterally.  Dermatologic Skin incision completely reepithelialized.  No signs of Deis is noted no complication noted reduction of pressure noted.  Orthopedic: No further tenderness to palpation noted about the surgical site.   Radiographs: 3 views of skeletally mature adult left foot: Good correction alignment noted osteotomy across the metatarsal third noted. Assessment:   1. Plantar flexed metatarsal, left   2. Status post foot surgery     Plan:  Patient was evaluated and treated and all questions answered.  S/p foot surgery left Clinic healed and officially discharged from my care I discussed shoe gear modification and if any foot and ankle issues arise future advised her to come back and see me.  She states  understanding  No follow-ups on file.

## 2022-02-06 DIAGNOSIS — Z940 Kidney transplant status: Secondary | ICD-10-CM | POA: Diagnosis not present

## 2022-02-13 ENCOUNTER — Other Ambulatory Visit: Payer: Self-pay | Admitting: Internal Medicine

## 2022-02-13 NOTE — Telephone Encounter (Signed)
Please advise Sir? Thank you. 

## 2022-02-13 NOTE — Telephone Encounter (Signed)
Pamela Mcknight informed and will check with PCP to see about future refills.

## 2022-02-13 NOTE — Telephone Encounter (Signed)
We can refill x 1 but she needs an OV for more refills unless she can get PCP to refill

## 2022-03-02 DIAGNOSIS — R6883 Chills (without fever): Secondary | ICD-10-CM | POA: Diagnosis not present

## 2022-03-19 DIAGNOSIS — H5213 Myopia, bilateral: Secondary | ICD-10-CM | POA: Diagnosis not present

## 2022-03-19 DIAGNOSIS — H52223 Regular astigmatism, bilateral: Secondary | ICD-10-CM | POA: Diagnosis not present

## 2022-03-19 DIAGNOSIS — H5005 Alternating esotropia: Secondary | ICD-10-CM | POA: Diagnosis not present

## 2022-03-19 DIAGNOSIS — H40023 Open angle with borderline findings, high risk, bilateral: Secondary | ICD-10-CM | POA: Diagnosis not present

## 2022-03-19 DIAGNOSIS — H524 Presbyopia: Secondary | ICD-10-CM | POA: Diagnosis not present

## 2022-03-19 DIAGNOSIS — H532 Diplopia: Secondary | ICD-10-CM | POA: Diagnosis not present

## 2022-03-19 DIAGNOSIS — H04123 Dry eye syndrome of bilateral lacrimal glands: Secondary | ICD-10-CM | POA: Diagnosis not present

## 2022-05-06 DIAGNOSIS — Z940 Kidney transplant status: Secondary | ICD-10-CM | POA: Diagnosis not present

## 2022-05-09 DIAGNOSIS — Z1389 Encounter for screening for other disorder: Secondary | ICD-10-CM | POA: Diagnosis not present

## 2022-05-09 DIAGNOSIS — Z0000 Encounter for general adult medical examination without abnormal findings: Secondary | ICD-10-CM | POA: Diagnosis not present

## 2022-05-09 DIAGNOSIS — Z6820 Body mass index (BMI) 20.0-20.9, adult: Secondary | ICD-10-CM | POA: Diagnosis not present

## 2022-05-10 DIAGNOSIS — I10 Essential (primary) hypertension: Secondary | ICD-10-CM | POA: Diagnosis not present

## 2022-05-10 DIAGNOSIS — Z940 Kidney transplant status: Secondary | ICD-10-CM | POA: Diagnosis not present

## 2022-05-10 DIAGNOSIS — Z79899 Other long term (current) drug therapy: Secondary | ICD-10-CM | POA: Diagnosis not present

## 2022-05-10 DIAGNOSIS — D649 Anemia, unspecified: Secondary | ICD-10-CM | POA: Diagnosis not present

## 2022-05-12 ENCOUNTER — Other Ambulatory Visit: Payer: Self-pay | Admitting: Internal Medicine

## 2022-05-15 DIAGNOSIS — K30 Functional dyspepsia: Secondary | ICD-10-CM | POA: Diagnosis not present

## 2022-05-15 DIAGNOSIS — R7303 Prediabetes: Secondary | ICD-10-CM | POA: Diagnosis not present

## 2022-05-15 DIAGNOSIS — Z940 Kidney transplant status: Secondary | ICD-10-CM | POA: Diagnosis not present

## 2022-05-15 DIAGNOSIS — I129 Hypertensive chronic kidney disease with stage 1 through stage 4 chronic kidney disease, or unspecified chronic kidney disease: Secondary | ICD-10-CM | POA: Diagnosis not present

## 2022-05-15 DIAGNOSIS — N1831 Chronic kidney disease, stage 3a: Secondary | ICD-10-CM | POA: Diagnosis not present

## 2022-05-15 DIAGNOSIS — Z23 Encounter for immunization: Secondary | ICD-10-CM | POA: Diagnosis not present

## 2022-05-15 DIAGNOSIS — I700 Atherosclerosis of aorta: Secondary | ICD-10-CM | POA: Diagnosis not present

## 2022-05-15 DIAGNOSIS — N951 Menopausal and female climacteric states: Secondary | ICD-10-CM | POA: Diagnosis not present

## 2022-06-24 DIAGNOSIS — D044 Carcinoma in situ of skin of scalp and neck: Secondary | ICD-10-CM | POA: Diagnosis not present

## 2022-06-24 DIAGNOSIS — D485 Neoplasm of uncertain behavior of skin: Secondary | ICD-10-CM | POA: Diagnosis not present

## 2022-06-24 DIAGNOSIS — C44729 Squamous cell carcinoma of skin of left lower limb, including hip: Secondary | ICD-10-CM | POA: Diagnosis not present

## 2022-07-04 DIAGNOSIS — Z79899 Other long term (current) drug therapy: Secondary | ICD-10-CM | POA: Diagnosis not present

## 2022-07-04 DIAGNOSIS — I10 Essential (primary) hypertension: Secondary | ICD-10-CM | POA: Diagnosis not present

## 2022-07-04 DIAGNOSIS — Z940 Kidney transplant status: Secondary | ICD-10-CM | POA: Diagnosis not present

## 2022-07-29 DIAGNOSIS — C4442 Squamous cell carcinoma of skin of scalp and neck: Secondary | ICD-10-CM | POA: Diagnosis not present

## 2022-08-06 DIAGNOSIS — Z01419 Encounter for gynecological examination (general) (routine) without abnormal findings: Secondary | ICD-10-CM | POA: Diagnosis not present

## 2022-08-07 DIAGNOSIS — Z1231 Encounter for screening mammogram for malignant neoplasm of breast: Secondary | ICD-10-CM | POA: Diagnosis not present

## 2022-08-12 DIAGNOSIS — C44729 Squamous cell carcinoma of skin of left lower limb, including hip: Secondary | ICD-10-CM | POA: Diagnosis not present

## 2022-08-14 DIAGNOSIS — Z940 Kidney transplant status: Secondary | ICD-10-CM | POA: Diagnosis not present

## 2022-09-02 DIAGNOSIS — Z5189 Encounter for other specified aftercare: Secondary | ICD-10-CM | POA: Diagnosis not present

## 2022-09-23 DIAGNOSIS — L821 Other seborrheic keratosis: Secondary | ICD-10-CM | POA: Diagnosis not present

## 2022-09-23 DIAGNOSIS — L570 Actinic keratosis: Secondary | ICD-10-CM | POA: Diagnosis not present

## 2022-09-23 DIAGNOSIS — Z9889 Other specified postprocedural states: Secondary | ICD-10-CM | POA: Diagnosis not present

## 2022-09-23 DIAGNOSIS — D225 Melanocytic nevi of trunk: Secondary | ICD-10-CM | POA: Diagnosis not present

## 2022-09-23 DIAGNOSIS — Z85828 Personal history of other malignant neoplasm of skin: Secondary | ICD-10-CM | POA: Diagnosis not present

## 2022-09-23 DIAGNOSIS — L851 Acquired keratosis [keratoderma] palmaris et plantaris: Secondary | ICD-10-CM | POA: Diagnosis not present

## 2022-09-23 DIAGNOSIS — L578 Other skin changes due to chronic exposure to nonionizing radiation: Secondary | ICD-10-CM | POA: Diagnosis not present

## 2022-09-23 DIAGNOSIS — L814 Other melanin hyperpigmentation: Secondary | ICD-10-CM | POA: Diagnosis not present

## 2022-11-18 DIAGNOSIS — Z79899 Other long term (current) drug therapy: Secondary | ICD-10-CM | POA: Diagnosis not present

## 2022-11-18 DIAGNOSIS — Z940 Kidney transplant status: Secondary | ICD-10-CM | POA: Diagnosis not present

## 2022-11-21 DIAGNOSIS — D649 Anemia, unspecified: Secondary | ICD-10-CM | POA: Diagnosis not present

## 2022-11-21 DIAGNOSIS — Z79899 Other long term (current) drug therapy: Secondary | ICD-10-CM | POA: Diagnosis not present

## 2022-11-21 DIAGNOSIS — Z940 Kidney transplant status: Secondary | ICD-10-CM | POA: Diagnosis not present

## 2022-11-21 DIAGNOSIS — I10 Essential (primary) hypertension: Secondary | ICD-10-CM | POA: Diagnosis not present

## 2022-12-18 IMAGING — CT CT CHEST-ABD-PELV W/ CM
3 series · 13 of 32 positions shown, 17 images · IV contrast (iopamidol)
Comparison: CT abdomen pelvis 07/01/2018, ultrasound thyroid
03/22/2019

CLINICAL DATA: Wt loss approx 8 lbs in 1 month unintended SOB and
cough Hysterectomy with oophorectomy-bilat Hx of renal transplant
secondary to PCK Former smoker

EXAM:
CT CHEST, ABDOMEN, AND PELVIS WITH CONTRAST
TECHNIQUE: Multidetector CT imaging of the chest, abdomen and pelvis was
performed following the standard protocol during bolus
administration of intravenous contrast.
CONTRAST:  80mL EVMGHI-WLL IOPAMIDOL (EVMGHI-WLL) INJECTION 61%

[Series 2: chest/abd/pelvis w/cm · axial · 0.70mm/px · z∈[-607,-62]mm · 8 of 131 slices shown]
[im 11/131  soft-tissue]
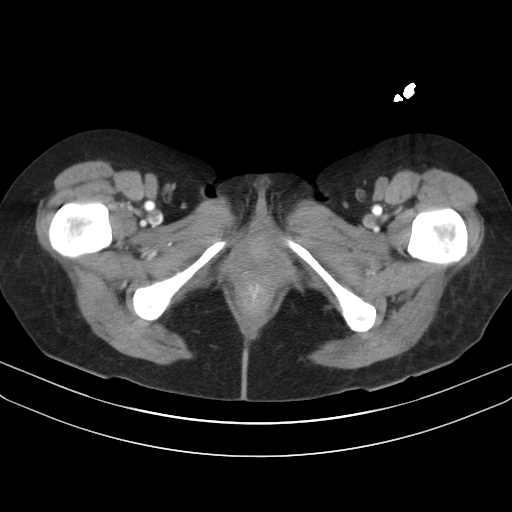
[im 33/131  soft-tissue]
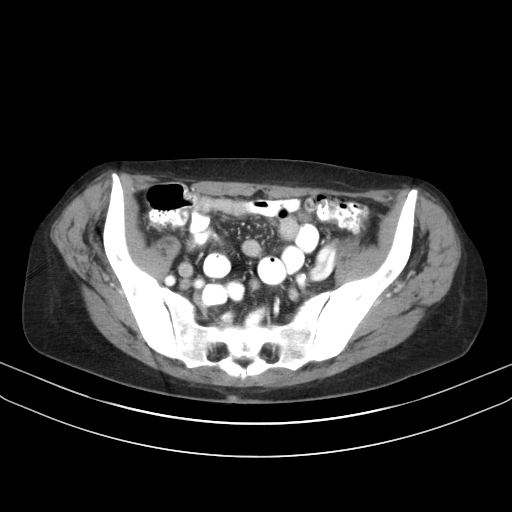
[im 44/131  soft-tissue]
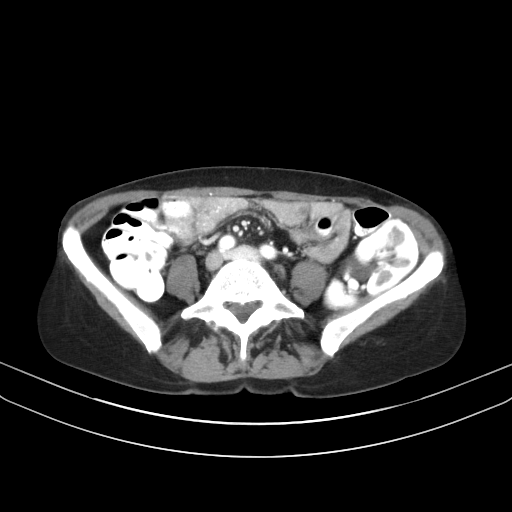
[im 55/131  soft-tissue]
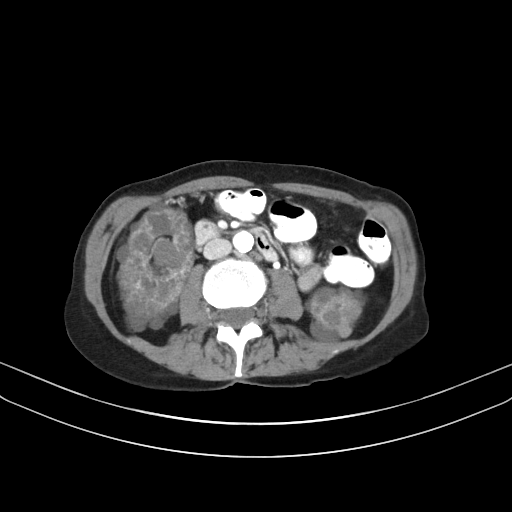
[im 76/131  soft-tissue]
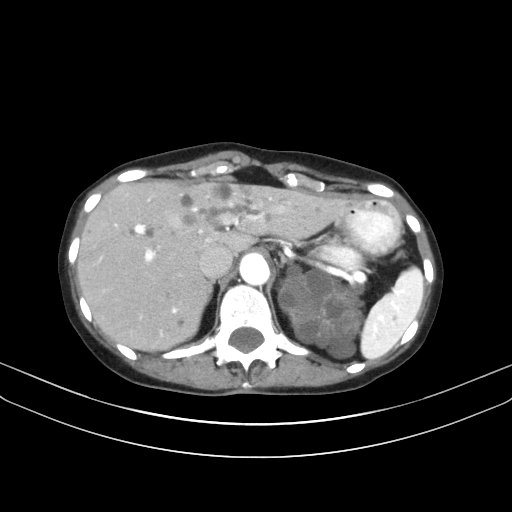
[im 87/131  soft-tissue]
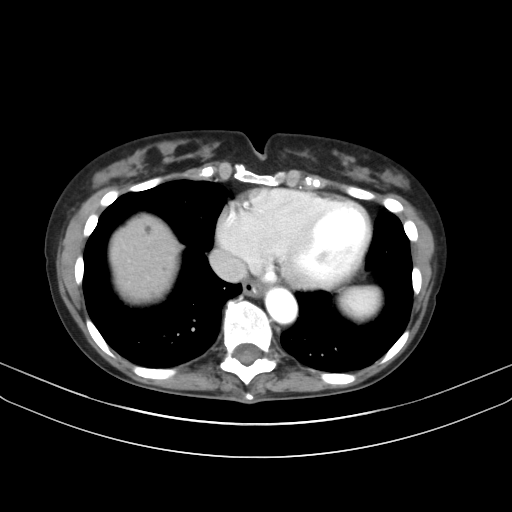
[im 98/131  soft-tissue]
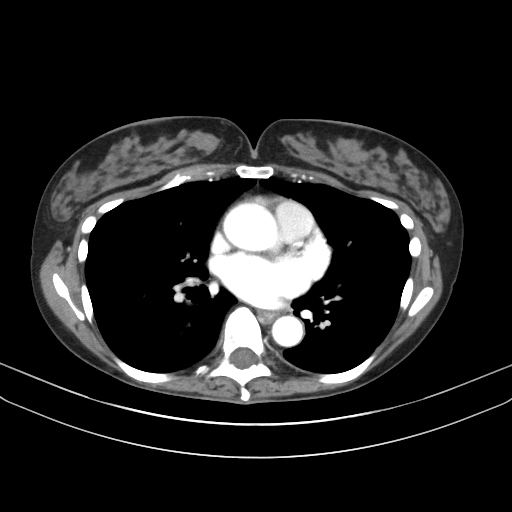
[im 120/131  soft-tissue]
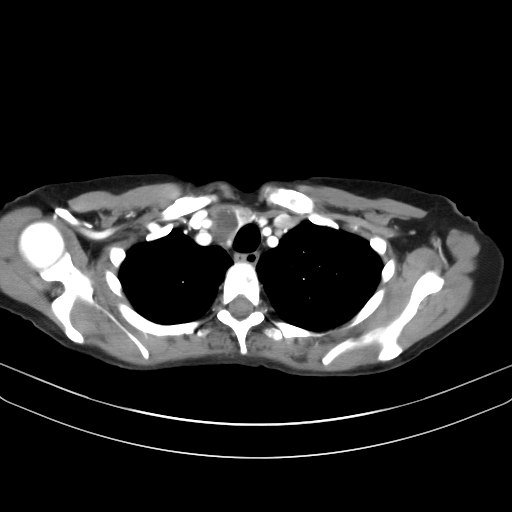

[Series 6: lung · axial · 0.62mm/px · z∈[-279,-235]mm · 2 of 148 slices shown]
[im 12/148  bone]
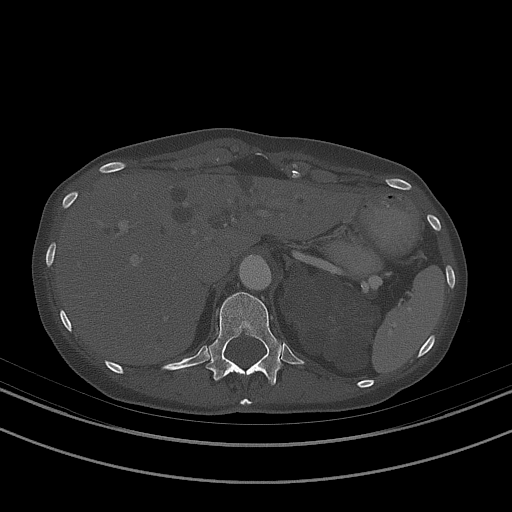
[im 34/148  bone]
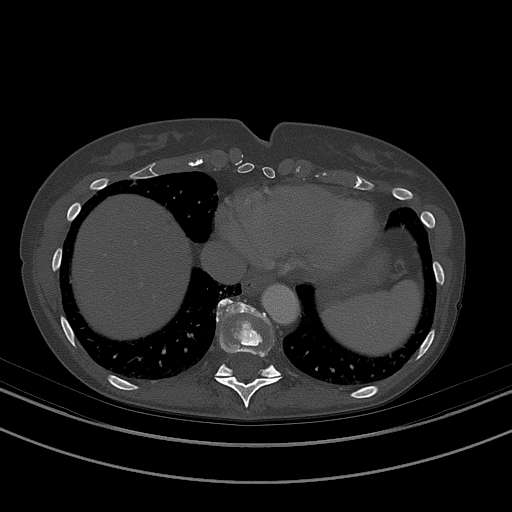

[Series 7: renal delay · axial · delayed · 0.70mm/px · z∈[-432,-302]mm · 3 of 52 slices shown, 7 images]
[im 13/52  soft-tissue]
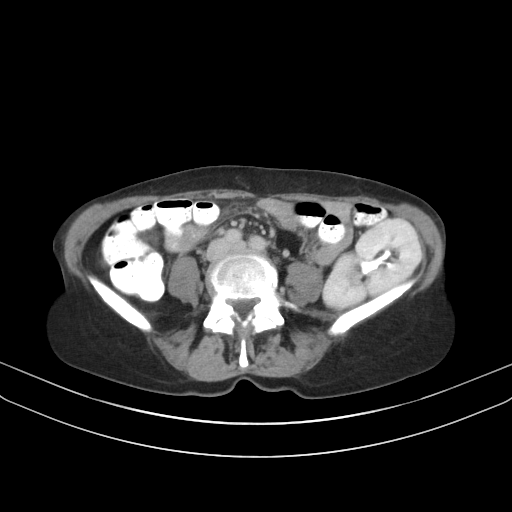
[im 13/52  lung]
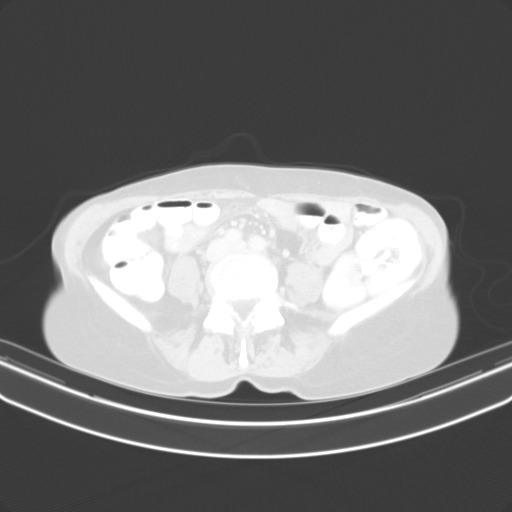
[im 13/52  bone]
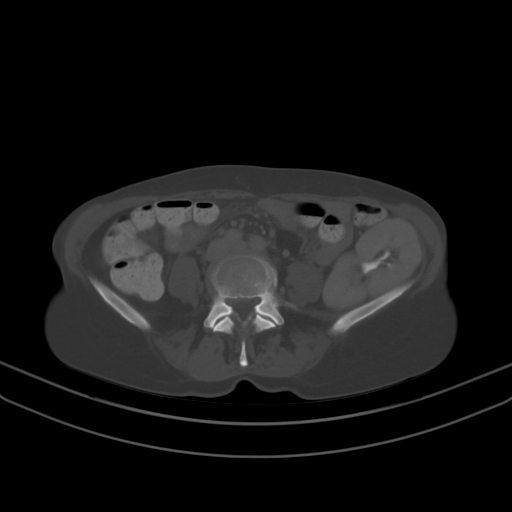
[im 26/52  soft-tissue]
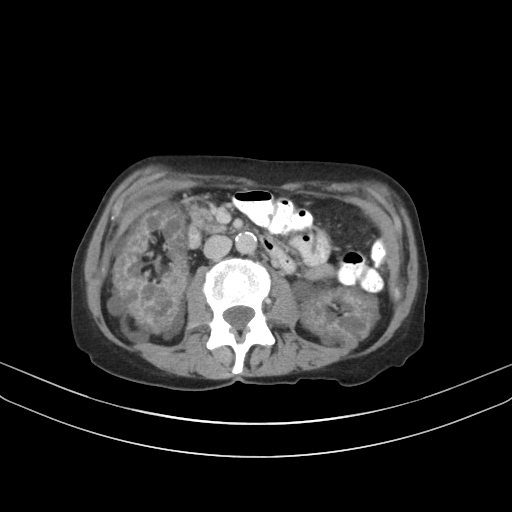
[im 26/52  lung]
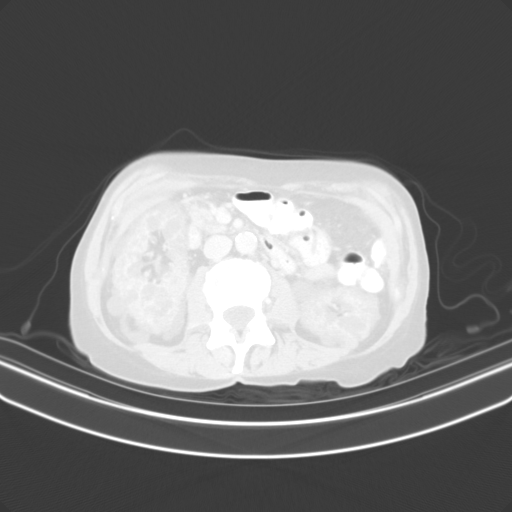
[im 39/52  soft-tissue]
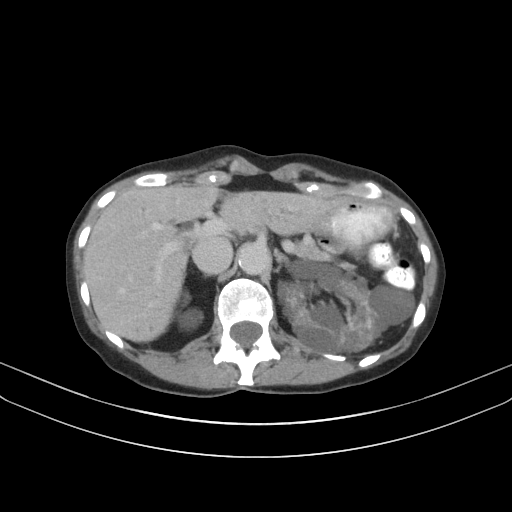
[im 39/52  lung]
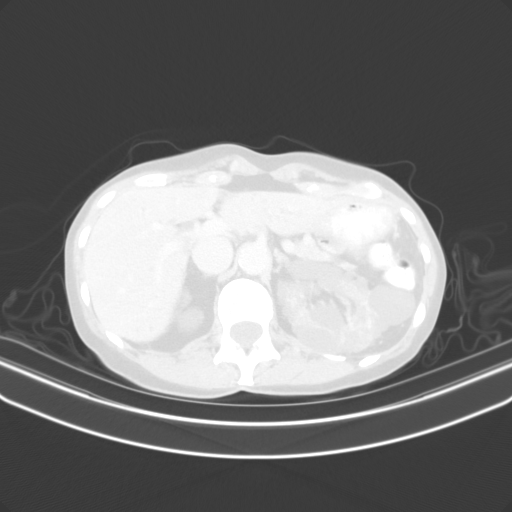

[13 of 32 positions shown; findings below may reference images not displayed]

FINDINGS: CT CHEST FINDINGS

Cardiovascular: Normal heart size. No significant pericardial
effusion. The thoracic aorta is normal in caliber. Mild
atherosclerotic plaque of the thoracic aorta. No definite coronary
artery calcifications. The main pulmonary artery is normal in
caliber.

Mediastinum/Nodes: No enlarged mediastinal, hilar, or axillary lymph
nodes. There is a 2.9 cm right hypodense thyroid gland nodule.
Several other subcentimeter hypodensities are noted. The trachea and
esophagus demonstrate no significant findings.

Lungs/Pleura: No focal consolidation. No pulmonary nodule. No
pulmonary mass. No pleural effusion. No pneumothorax.

Musculoskeletal:

No chest wall abnormality. Coarse bilateral breast calcifications.
Associated left breast 1.3 cm fluid density lesion ([DATE]).

Vague central medullary stem sclerosis of the sternum ([DATE]).
Otherwise no suspicious lytic or blastic osseous lesions. No acute
displaced fracture. Multilevel degenerative changes of the spine.

CT ABDOMEN PELVIS FINDINGS

Hepatobiliary: Redemonstration of multiple fluid density lesions
within the hepatic parenchyma that appear grossly similar in size
likely representing simple hepatic cysts. Innumerable subcentimeter
hypodensities are too small to characterize. Status post
cholecystectomy. No intra or extrahepatic biliary dilatation.

Pancreas: No focal lesion. Normal pancreatic contour. No surrounding
inflammatory changes. No main pancreatic ductal dilatation.

Spleen: Redemonstration of subcentimeter hypodensities are too small
to characterize. Otherwise no focal abnormality. Normal in size.

Adrenals/Urinary Tract:

No adrenal nodule bilaterally.

Redemonstration of enlarged bilateral native kidneys with
innumerable subcentimeter hypodensities and fluid density lesions
chest. Marked renal cortical scarring and thinning. Bilateral native
kidneys enhance symmetrically. Several bilateral calcified stones
measuring up to 3 mm on the right and 2 mm on the left are noted. No
hydronephrosis. No hydroureter.

Redemonstration of left iliac fossa renal transplant. No
nephrolithiasis or hydronephrosis. No ureterolithiasis or
hydroureter. No mass lesion noted.

The urinary bladder is unremarkable.

Stomach/Bowel: PO contrast reaches the rectum. Stomach is within
normal limits. No evidence of bowel wall thickening or dilatation.
Scattered colonic diverticulosis. Appendix appears normal.

Vascular/Lymphatic: No abdominal aorta or iliac aneurysm. Mild to
moderate atherosclerotic plaque of the aorta and its branches. No
abdominal, pelvic, or inguinal lymphadenopathy.

Reproductive: Status post hysterectomy. No adnexal masses.

Other: No intraperitoneal free fluid. No intraperitoneal free gas.
No organized fluid collection.

Musculoskeletal:

No abdominal wall hernia or abnormality.

No suspicious lytic or blastic osseous lesions. No acute displaced
fracture. Multilevel degenerative changes of the spine.
IMPRESSION: 1. A 1.3 cm fluid density lesion within the left breast as well as
vague sclerotic lesions of the sternum. Recommend further evaluation
with diagnostic mammogram.
2. Innumerable renal and hepatic cystic lesions consistent with of
autosomal dominant polycystic kidney disease status post left iliac
renal transplant. Given patient history, consider MRI renal protocol
for further evaluation.
3. Nonobstructive bilateral nephrolithiasis of the native kidneys.
4. A 2.9 cm right hypodense thyroid gland nodule as well as several
other bilateral subcentimeter hypodensities. This has been evaluated
on previous imaging. (ref: [HOSPITAL]. [DATE]):
5. Scattered colonic diverticulosis with no acute diverticulitis.
6.  Aortic Atherosclerosis ([id number]).

## 2023-02-10 DIAGNOSIS — Z940 Kidney transplant status: Secondary | ICD-10-CM | POA: Diagnosis not present

## 2023-03-20 DIAGNOSIS — H903 Sensorineural hearing loss, bilateral: Secondary | ICD-10-CM | POA: Diagnosis not present

## 2023-03-25 DIAGNOSIS — H5213 Myopia, bilateral: Secondary | ICD-10-CM | POA: Diagnosis not present

## 2023-03-25 DIAGNOSIS — H532 Diplopia: Secondary | ICD-10-CM | POA: Diagnosis not present

## 2023-03-25 DIAGNOSIS — H52223 Regular astigmatism, bilateral: Secondary | ICD-10-CM | POA: Diagnosis not present

## 2023-03-25 DIAGNOSIS — H40023 Open angle with borderline findings, high risk, bilateral: Secondary | ICD-10-CM | POA: Diagnosis not present

## 2023-03-25 DIAGNOSIS — H04123 Dry eye syndrome of bilateral lacrimal glands: Secondary | ICD-10-CM | POA: Diagnosis not present

## 2023-03-25 DIAGNOSIS — H5005 Alternating esotropia: Secondary | ICD-10-CM | POA: Diagnosis not present

## 2023-03-25 DIAGNOSIS — H524 Presbyopia: Secondary | ICD-10-CM | POA: Diagnosis not present

## 2023-03-31 DIAGNOSIS — Z940 Kidney transplant status: Secondary | ICD-10-CM | POA: Diagnosis not present

## 2023-05-12 DIAGNOSIS — Z23 Encounter for immunization: Secondary | ICD-10-CM | POA: Diagnosis not present

## 2023-05-12 DIAGNOSIS — Z1331 Encounter for screening for depression: Secondary | ICD-10-CM | POA: Diagnosis not present

## 2023-05-12 DIAGNOSIS — Z6820 Body mass index (BMI) 20.0-20.9, adult: Secondary | ICD-10-CM | POA: Diagnosis not present

## 2023-05-12 DIAGNOSIS — Z0000 Encounter for general adult medical examination without abnormal findings: Secondary | ICD-10-CM | POA: Diagnosis not present

## 2023-05-14 DIAGNOSIS — H9113 Presbycusis, bilateral: Secondary | ICD-10-CM | POA: Diagnosis not present

## 2023-05-14 DIAGNOSIS — Z940 Kidney transplant status: Secondary | ICD-10-CM | POA: Diagnosis not present

## 2023-05-14 DIAGNOSIS — N1831 Chronic kidney disease, stage 3a: Secondary | ICD-10-CM | POA: Diagnosis not present

## 2023-05-14 DIAGNOSIS — K30 Functional dyspepsia: Secondary | ICD-10-CM | POA: Diagnosis not present

## 2023-05-14 DIAGNOSIS — N951 Menopausal and female climacteric states: Secondary | ICD-10-CM | POA: Diagnosis not present

## 2023-05-14 DIAGNOSIS — E7800 Pure hypercholesterolemia, unspecified: Secondary | ICD-10-CM | POA: Diagnosis not present

## 2023-05-14 DIAGNOSIS — I129 Hypertensive chronic kidney disease with stage 1 through stage 4 chronic kidney disease, or unspecified chronic kidney disease: Secondary | ICD-10-CM | POA: Diagnosis not present

## 2023-05-14 DIAGNOSIS — R7303 Prediabetes: Secondary | ICD-10-CM | POA: Diagnosis not present

## 2023-05-14 DIAGNOSIS — I700 Atherosclerosis of aorta: Secondary | ICD-10-CM | POA: Diagnosis not present

## 2023-05-19 DIAGNOSIS — Z940 Kidney transplant status: Secondary | ICD-10-CM | POA: Diagnosis not present

## 2023-05-23 DIAGNOSIS — Z940 Kidney transplant status: Secondary | ICD-10-CM | POA: Diagnosis not present

## 2023-05-27 DIAGNOSIS — D649 Anemia, unspecified: Secondary | ICD-10-CM | POA: Diagnosis not present

## 2023-05-27 DIAGNOSIS — Z940 Kidney transplant status: Secondary | ICD-10-CM | POA: Diagnosis not present

## 2023-05-27 DIAGNOSIS — Z7960 Long term (current) use of unspecified immunomodulators and immunosuppressants: Secondary | ICD-10-CM | POA: Diagnosis not present

## 2023-05-27 DIAGNOSIS — I10 Essential (primary) hypertension: Secondary | ICD-10-CM | POA: Diagnosis not present

## 2023-07-24 DIAGNOSIS — Z79899 Other long term (current) drug therapy: Secondary | ICD-10-CM | POA: Diagnosis not present

## 2023-07-24 DIAGNOSIS — I10 Essential (primary) hypertension: Secondary | ICD-10-CM | POA: Diagnosis not present

## 2023-07-24 DIAGNOSIS — Z4822 Encounter for aftercare following kidney transplant: Secondary | ICD-10-CM | POA: Diagnosis not present

## 2023-07-24 DIAGNOSIS — I129 Hypertensive chronic kidney disease with stage 1 through stage 4 chronic kidney disease, or unspecified chronic kidney disease: Secondary | ICD-10-CM | POA: Diagnosis not present

## 2023-07-24 DIAGNOSIS — Z5181 Encounter for therapeutic drug level monitoring: Secondary | ICD-10-CM | POA: Diagnosis not present

## 2023-07-24 DIAGNOSIS — Z9489 Other transplanted organ and tissue status: Secondary | ICD-10-CM | POA: Diagnosis not present

## 2023-07-24 DIAGNOSIS — N189 Chronic kidney disease, unspecified: Secondary | ICD-10-CM | POA: Diagnosis not present

## 2023-07-24 DIAGNOSIS — I341 Nonrheumatic mitral (valve) prolapse: Secondary | ICD-10-CM | POA: Diagnosis not present

## 2023-07-24 DIAGNOSIS — R3915 Urgency of urination: Secondary | ICD-10-CM | POA: Diagnosis not present

## 2023-07-24 DIAGNOSIS — Z940 Kidney transplant status: Secondary | ICD-10-CM | POA: Diagnosis not present

## 2023-08-13 DIAGNOSIS — Z1231 Encounter for screening mammogram for malignant neoplasm of breast: Secondary | ICD-10-CM | POA: Diagnosis not present

## 2023-08-28 DIAGNOSIS — Z940 Kidney transplant status: Secondary | ICD-10-CM | POA: Diagnosis not present

## 2023-09-26 DIAGNOSIS — Z6820 Body mass index (BMI) 20.0-20.9, adult: Secondary | ICD-10-CM | POA: Diagnosis not present

## 2023-09-26 DIAGNOSIS — N3001 Acute cystitis with hematuria: Secondary | ICD-10-CM | POA: Diagnosis not present

## 2023-09-26 DIAGNOSIS — R399 Unspecified symptoms and signs involving the genitourinary system: Secondary | ICD-10-CM | POA: Diagnosis not present

## 2023-10-13 DIAGNOSIS — D225 Melanocytic nevi of trunk: Secondary | ICD-10-CM | POA: Diagnosis not present

## 2023-10-13 DIAGNOSIS — L851 Acquired keratosis [keratoderma] palmaris et plantaris: Secondary | ICD-10-CM | POA: Diagnosis not present

## 2023-10-13 DIAGNOSIS — L219 Seborrheic dermatitis, unspecified: Secondary | ICD-10-CM | POA: Diagnosis not present

## 2023-10-13 DIAGNOSIS — L814 Other melanin hyperpigmentation: Secondary | ICD-10-CM | POA: Diagnosis not present

## 2023-10-13 DIAGNOSIS — L821 Other seborrheic keratosis: Secondary | ICD-10-CM | POA: Diagnosis not present

## 2023-10-13 DIAGNOSIS — L578 Other skin changes due to chronic exposure to nonionizing radiation: Secondary | ICD-10-CM | POA: Diagnosis not present

## 2023-10-13 DIAGNOSIS — Z85828 Personal history of other malignant neoplasm of skin: Secondary | ICD-10-CM | POA: Diagnosis not present

## 2023-10-13 DIAGNOSIS — D485 Neoplasm of uncertain behavior of skin: Secondary | ICD-10-CM | POA: Diagnosis not present

## 2023-10-13 DIAGNOSIS — D0462 Carcinoma in situ of skin of left upper limb, including shoulder: Secondary | ICD-10-CM | POA: Diagnosis not present

## 2023-10-28 DIAGNOSIS — D0462 Carcinoma in situ of skin of left upper limb, including shoulder: Secondary | ICD-10-CM | POA: Diagnosis not present

## 2023-11-04 DIAGNOSIS — R300 Dysuria: Secondary | ICD-10-CM | POA: Diagnosis not present

## 2023-11-04 DIAGNOSIS — R310 Gross hematuria: Secondary | ICD-10-CM | POA: Diagnosis not present

## 2023-11-04 DIAGNOSIS — N3001 Acute cystitis with hematuria: Secondary | ICD-10-CM | POA: Diagnosis not present

## 2023-11-12 DIAGNOSIS — Z940 Kidney transplant status: Secondary | ICD-10-CM | POA: Diagnosis not present

## 2023-11-12 DIAGNOSIS — R739 Hyperglycemia, unspecified: Secondary | ICD-10-CM | POA: Diagnosis not present

## 2023-11-17 DIAGNOSIS — D649 Anemia, unspecified: Secondary | ICD-10-CM | POA: Diagnosis not present

## 2023-11-17 DIAGNOSIS — N390 Urinary tract infection, site not specified: Secondary | ICD-10-CM | POA: Diagnosis not present

## 2023-11-17 DIAGNOSIS — Z940 Kidney transplant status: Secondary | ICD-10-CM | POA: Diagnosis not present

## 2023-11-17 DIAGNOSIS — Z7960 Long term (current) use of unspecified immunomodulators and immunosuppressants: Secondary | ICD-10-CM | POA: Diagnosis not present

## 2023-11-17 DIAGNOSIS — I10 Essential (primary) hypertension: Secondary | ICD-10-CM | POA: Diagnosis not present

## 2023-12-16 DIAGNOSIS — R3915 Urgency of urination: Secondary | ICD-10-CM | POA: Diagnosis not present

## 2023-12-16 DIAGNOSIS — R310 Gross hematuria: Secondary | ICD-10-CM | POA: Diagnosis not present

## 2023-12-24 DIAGNOSIS — N289 Disorder of kidney and ureter, unspecified: Secondary | ICD-10-CM | POA: Diagnosis not present

## 2023-12-24 DIAGNOSIS — R310 Gross hematuria: Secondary | ICD-10-CM | POA: Diagnosis not present

## 2023-12-24 DIAGNOSIS — K5730 Diverticulosis of large intestine without perforation or abscess without bleeding: Secondary | ICD-10-CM | POA: Diagnosis not present

## 2024-01-29 DIAGNOSIS — R310 Gross hematuria: Secondary | ICD-10-CM | POA: Diagnosis not present

## 2024-01-29 DIAGNOSIS — R300 Dysuria: Secondary | ICD-10-CM | POA: Diagnosis not present
